# Patient Record
Sex: Female | Born: 1945 | Race: White | Hispanic: No | Marital: Married | Smoking: Never smoker
Health system: Southern US, Community
[De-identification: ages and names within clinical notes are randomized; demographics above are authoritative.]

## PROBLEM LIST (undated history)

## (undated) DIAGNOSIS — I10 Essential (primary) hypertension: Secondary | ICD-10-CM

## (undated) DIAGNOSIS — E119 Type 2 diabetes mellitus without complications: Secondary | ICD-10-CM

## (undated) DIAGNOSIS — M1990 Unspecified osteoarthritis, unspecified site: Secondary | ICD-10-CM

---

## 1997-11-14 ENCOUNTER — Ambulatory Visit (HOSPITAL_COMMUNITY): Admission: RE | Admit: 1997-11-14 | Discharge: 1997-11-14 | Payer: Self-pay | Admitting: Gastroenterology

## 1999-10-08 ENCOUNTER — Encounter: Payer: Self-pay | Admitting: Family Medicine

## 1999-10-08 ENCOUNTER — Encounter: Admission: RE | Admit: 1999-10-08 | Discharge: 1999-10-08 | Payer: Self-pay | Admitting: Family Medicine

## 2000-10-20 ENCOUNTER — Encounter: Admission: RE | Admit: 2000-10-20 | Discharge: 2000-10-20 | Payer: Self-pay | Admitting: Family Medicine

## 2000-10-20 ENCOUNTER — Encounter: Payer: Self-pay | Admitting: Family Medicine

## 2000-11-07 ENCOUNTER — Ambulatory Visit (HOSPITAL_COMMUNITY): Admission: RE | Admit: 2000-11-07 | Discharge: 2000-11-07 | Payer: Self-pay | Admitting: Family Medicine

## 2002-04-11 ENCOUNTER — Encounter: Payer: Self-pay | Admitting: Family Medicine

## 2002-04-11 ENCOUNTER — Encounter: Admission: RE | Admit: 2002-04-11 | Discharge: 2002-04-11 | Payer: Self-pay | Admitting: Family Medicine

## 2003-04-10 ENCOUNTER — Other Ambulatory Visit: Admission: RE | Admit: 2003-04-10 | Discharge: 2003-04-10 | Payer: Self-pay | Admitting: Pediatric Nephrology

## 2008-06-19 ENCOUNTER — Other Ambulatory Visit: Admission: RE | Admit: 2008-06-19 | Discharge: 2008-06-19 | Payer: Self-pay | Admitting: Family Medicine

## 2008-06-25 ENCOUNTER — Encounter: Admission: RE | Admit: 2008-06-25 | Discharge: 2008-06-25 | Payer: Self-pay | Admitting: Family Medicine

## 2011-03-15 DIAGNOSIS — I10 Essential (primary) hypertension: Secondary | ICD-10-CM | POA: Diagnosis not present

## 2011-03-15 DIAGNOSIS — E119 Type 2 diabetes mellitus without complications: Secondary | ICD-10-CM | POA: Diagnosis not present

## 2011-03-15 DIAGNOSIS — E7800 Pure hypercholesterolemia, unspecified: Secondary | ICD-10-CM | POA: Diagnosis not present

## 2011-09-20 DIAGNOSIS — E7800 Pure hypercholesterolemia, unspecified: Secondary | ICD-10-CM | POA: Diagnosis not present

## 2011-09-20 DIAGNOSIS — R21 Rash and other nonspecific skin eruption: Secondary | ICD-10-CM | POA: Diagnosis not present

## 2011-09-20 DIAGNOSIS — I10 Essential (primary) hypertension: Secondary | ICD-10-CM | POA: Diagnosis not present

## 2011-09-20 DIAGNOSIS — E119 Type 2 diabetes mellitus without complications: Secondary | ICD-10-CM | POA: Diagnosis not present

## 2011-09-20 DIAGNOSIS — Z23 Encounter for immunization: Secondary | ICD-10-CM | POA: Diagnosis not present

## 2011-09-20 DIAGNOSIS — Z79899 Other long term (current) drug therapy: Secondary | ICD-10-CM | POA: Diagnosis not present

## 2012-03-19 DIAGNOSIS — E119 Type 2 diabetes mellitus without complications: Secondary | ICD-10-CM | POA: Diagnosis not present

## 2012-03-19 DIAGNOSIS — Z1331 Encounter for screening for depression: Secondary | ICD-10-CM | POA: Diagnosis not present

## 2012-03-19 DIAGNOSIS — E7800 Pure hypercholesterolemia, unspecified: Secondary | ICD-10-CM | POA: Diagnosis not present

## 2012-03-19 DIAGNOSIS — I10 Essential (primary) hypertension: Secondary | ICD-10-CM | POA: Diagnosis not present

## 2012-06-07 DIAGNOSIS — E119 Type 2 diabetes mellitus without complications: Secondary | ICD-10-CM | POA: Diagnosis not present

## 2012-06-07 DIAGNOSIS — H2589 Other age-related cataract: Secondary | ICD-10-CM | POA: Diagnosis not present

## 2012-06-07 DIAGNOSIS — D3130 Benign neoplasm of unspecified choroid: Secondary | ICD-10-CM | POA: Diagnosis not present

## 2012-09-19 DIAGNOSIS — I10 Essential (primary) hypertension: Secondary | ICD-10-CM | POA: Diagnosis not present

## 2012-09-19 DIAGNOSIS — E7800 Pure hypercholesterolemia, unspecified: Secondary | ICD-10-CM | POA: Diagnosis not present

## 2012-09-19 DIAGNOSIS — E119 Type 2 diabetes mellitus without complications: Secondary | ICD-10-CM | POA: Diagnosis not present

## 2012-09-19 DIAGNOSIS — Z23 Encounter for immunization: Secondary | ICD-10-CM | POA: Diagnosis not present

## 2012-12-18 DIAGNOSIS — E7800 Pure hypercholesterolemia, unspecified: Secondary | ICD-10-CM | POA: Diagnosis not present

## 2012-12-18 DIAGNOSIS — I10 Essential (primary) hypertension: Secondary | ICD-10-CM | POA: Diagnosis not present

## 2012-12-18 DIAGNOSIS — Z23 Encounter for immunization: Secondary | ICD-10-CM | POA: Diagnosis not present

## 2012-12-18 DIAGNOSIS — E119 Type 2 diabetes mellitus without complications: Secondary | ICD-10-CM | POA: Diagnosis not present

## 2013-06-18 DIAGNOSIS — Z1331 Encounter for screening for depression: Secondary | ICD-10-CM | POA: Diagnosis not present

## 2013-06-18 DIAGNOSIS — E7800 Pure hypercholesterolemia, unspecified: Secondary | ICD-10-CM | POA: Diagnosis not present

## 2013-06-18 DIAGNOSIS — I10 Essential (primary) hypertension: Secondary | ICD-10-CM | POA: Diagnosis not present

## 2013-06-18 DIAGNOSIS — Z1211 Encounter for screening for malignant neoplasm of colon: Secondary | ICD-10-CM | POA: Diagnosis not present

## 2013-06-18 DIAGNOSIS — E119 Type 2 diabetes mellitus without complications: Secondary | ICD-10-CM | POA: Diagnosis not present

## 2013-06-27 DIAGNOSIS — H2589 Other age-related cataract: Secondary | ICD-10-CM | POA: Diagnosis not present

## 2013-06-27 DIAGNOSIS — D3130 Benign neoplasm of unspecified choroid: Secondary | ICD-10-CM | POA: Diagnosis not present

## 2013-06-27 DIAGNOSIS — E119 Type 2 diabetes mellitus without complications: Secondary | ICD-10-CM | POA: Diagnosis not present

## 2013-08-16 DIAGNOSIS — K5730 Diverticulosis of large intestine without perforation or abscess without bleeding: Secondary | ICD-10-CM | POA: Diagnosis not present

## 2013-08-16 DIAGNOSIS — D126 Benign neoplasm of colon, unspecified: Secondary | ICD-10-CM | POA: Diagnosis not present

## 2013-08-16 DIAGNOSIS — Z1211 Encounter for screening for malignant neoplasm of colon: Secondary | ICD-10-CM | POA: Diagnosis not present

## 2013-12-18 DIAGNOSIS — Z23 Encounter for immunization: Secondary | ICD-10-CM | POA: Diagnosis not present

## 2013-12-18 DIAGNOSIS — E119 Type 2 diabetes mellitus without complications: Secondary | ICD-10-CM | POA: Diagnosis not present

## 2013-12-18 DIAGNOSIS — I10 Essential (primary) hypertension: Secondary | ICD-10-CM | POA: Diagnosis not present

## 2013-12-18 DIAGNOSIS — E780 Pure hypercholesterolemia: Secondary | ICD-10-CM | POA: Diagnosis not present

## 2014-03-05 DIAGNOSIS — R05 Cough: Secondary | ICD-10-CM | POA: Diagnosis not present

## 2014-03-05 DIAGNOSIS — J988 Other specified respiratory disorders: Secondary | ICD-10-CM | POA: Diagnosis not present

## 2014-06-20 DIAGNOSIS — Z23 Encounter for immunization: Secondary | ICD-10-CM | POA: Diagnosis not present

## 2014-06-20 DIAGNOSIS — E780 Pure hypercholesterolemia: Secondary | ICD-10-CM | POA: Diagnosis not present

## 2014-06-20 DIAGNOSIS — I10 Essential (primary) hypertension: Secondary | ICD-10-CM | POA: Diagnosis not present

## 2014-06-20 DIAGNOSIS — E119 Type 2 diabetes mellitus without complications: Secondary | ICD-10-CM | POA: Diagnosis not present

## 2014-06-20 DIAGNOSIS — R21 Rash and other nonspecific skin eruption: Secondary | ICD-10-CM | POA: Diagnosis not present

## 2014-06-20 DIAGNOSIS — Z1389 Encounter for screening for other disorder: Secondary | ICD-10-CM | POA: Diagnosis not present

## 2014-06-20 DIAGNOSIS — Z794 Long term (current) use of insulin: Secondary | ICD-10-CM | POA: Diagnosis not present

## 2014-06-20 DIAGNOSIS — M1990 Unspecified osteoarthritis, unspecified site: Secondary | ICD-10-CM | POA: Diagnosis not present

## 2014-06-24 DIAGNOSIS — M1712 Unilateral primary osteoarthritis, left knee: Secondary | ICD-10-CM | POA: Diagnosis not present

## 2014-06-30 DIAGNOSIS — H2513 Age-related nuclear cataract, bilateral: Secondary | ICD-10-CM | POA: Diagnosis not present

## 2014-06-30 DIAGNOSIS — E119 Type 2 diabetes mellitus without complications: Secondary | ICD-10-CM | POA: Diagnosis not present

## 2014-06-30 DIAGNOSIS — H524 Presbyopia: Secondary | ICD-10-CM | POA: Diagnosis not present

## 2014-09-18 DIAGNOSIS — M1712 Unilateral primary osteoarthritis, left knee: Secondary | ICD-10-CM | POA: Diagnosis not present

## 2015-01-13 DIAGNOSIS — I10 Essential (primary) hypertension: Secondary | ICD-10-CM | POA: Diagnosis not present

## 2015-01-13 DIAGNOSIS — Z23 Encounter for immunization: Secondary | ICD-10-CM | POA: Diagnosis not present

## 2015-01-13 DIAGNOSIS — Z0000 Encounter for general adult medical examination without abnormal findings: Secondary | ICD-10-CM | POA: Diagnosis not present

## 2015-01-13 DIAGNOSIS — Z01411 Encounter for gynecological examination (general) (routine) with abnormal findings: Secondary | ICD-10-CM | POA: Diagnosis not present

## 2015-01-13 DIAGNOSIS — Z794 Long term (current) use of insulin: Secondary | ICD-10-CM | POA: Diagnosis not present

## 2015-01-13 DIAGNOSIS — M1990 Unspecified osteoarthritis, unspecified site: Secondary | ICD-10-CM | POA: Diagnosis not present

## 2015-01-13 DIAGNOSIS — Z1389 Encounter for screening for other disorder: Secondary | ICD-10-CM | POA: Diagnosis not present

## 2015-01-13 DIAGNOSIS — E7800 Pure hypercholesterolemia, unspecified: Secondary | ICD-10-CM | POA: Diagnosis not present

## 2015-01-13 DIAGNOSIS — E119 Type 2 diabetes mellitus without complications: Secondary | ICD-10-CM | POA: Diagnosis not present

## 2015-01-13 DIAGNOSIS — Z7984 Long term (current) use of oral hypoglycemic drugs: Secondary | ICD-10-CM | POA: Diagnosis not present

## 2015-01-20 ENCOUNTER — Other Ambulatory Visit: Payer: Self-pay | Admitting: Nurse Practitioner

## 2015-01-20 ENCOUNTER — Other Ambulatory Visit (HOSPITAL_COMMUNITY)
Admission: RE | Admit: 2015-01-20 | Discharge: 2015-01-20 | Disposition: A | Discharge status: Home / Self Care | Payer: Medicare Other | Source: Ambulatory Visit | Attending: Nurse Practitioner | Admitting: Nurse Practitioner

## 2015-01-20 DIAGNOSIS — Z124 Encounter for screening for malignant neoplasm of cervix: Secondary | ICD-10-CM | POA: Diagnosis not present

## 2015-01-20 DIAGNOSIS — Z01419 Encounter for gynecological examination (general) (routine) without abnormal findings: Secondary | ICD-10-CM | POA: Insufficient documentation

## 2015-01-20 DIAGNOSIS — B372 Candidiasis of skin and nail: Secondary | ICD-10-CM | POA: Diagnosis not present

## 2015-01-20 DIAGNOSIS — Z1231 Encounter for screening mammogram for malignant neoplasm of breast: Secondary | ICD-10-CM

## 2015-01-20 DIAGNOSIS — Z1151 Encounter for screening for human papillomavirus (HPV): Secondary | ICD-10-CM | POA: Insufficient documentation

## 2015-01-20 DIAGNOSIS — I868 Varicose veins of other specified sites: Secondary | ICD-10-CM | POA: Diagnosis not present

## 2015-01-22 LAB — CYTOLOGY - PAP

## 2015-02-06 ENCOUNTER — Ambulatory Visit
Admission: RE | Admit: 2015-02-06 | Discharge: 2015-02-06 | Disposition: A | Discharge status: Home / Self Care | Payer: Medicare Other | Source: Ambulatory Visit | Attending: Nurse Practitioner | Admitting: Nurse Practitioner

## 2015-02-06 ENCOUNTER — Other Ambulatory Visit: Payer: Self-pay | Admitting: Family Medicine

## 2015-02-06 DIAGNOSIS — Z1231 Encounter for screening mammogram for malignant neoplasm of breast: Secondary | ICD-10-CM

## 2015-05-12 DIAGNOSIS — Z6841 Body Mass Index (BMI) 40.0 and over, adult: Secondary | ICD-10-CM | POA: Diagnosis not present

## 2015-05-12 DIAGNOSIS — M1712 Unilateral primary osteoarthritis, left knee: Secondary | ICD-10-CM | POA: Diagnosis not present

## 2015-05-12 DIAGNOSIS — M7052 Other bursitis of knee, left knee: Secondary | ICD-10-CM | POA: Diagnosis not present

## 2015-05-15 DIAGNOSIS — M1712 Unilateral primary osteoarthritis, left knee: Secondary | ICD-10-CM | POA: Diagnosis not present

## 2015-05-19 DIAGNOSIS — M1712 Unilateral primary osteoarthritis, left knee: Secondary | ICD-10-CM | POA: Diagnosis not present

## 2015-05-21 DIAGNOSIS — M1712 Unilateral primary osteoarthritis, left knee: Secondary | ICD-10-CM | POA: Diagnosis not present

## 2015-05-26 DIAGNOSIS — M1712 Unilateral primary osteoarthritis, left knee: Secondary | ICD-10-CM | POA: Diagnosis not present

## 2015-05-29 DIAGNOSIS — M1712 Unilateral primary osteoarthritis, left knee: Secondary | ICD-10-CM | POA: Diagnosis not present

## 2015-06-02 DIAGNOSIS — M1712 Unilateral primary osteoarthritis, left knee: Secondary | ICD-10-CM | POA: Diagnosis not present

## 2015-06-05 DIAGNOSIS — M1712 Unilateral primary osteoarthritis, left knee: Secondary | ICD-10-CM | POA: Diagnosis not present

## 2015-06-08 DIAGNOSIS — M1712 Unilateral primary osteoarthritis, left knee: Secondary | ICD-10-CM | POA: Diagnosis not present

## 2015-06-12 DIAGNOSIS — M1712 Unilateral primary osteoarthritis, left knee: Secondary | ICD-10-CM | POA: Diagnosis not present

## 2015-06-23 DIAGNOSIS — M1712 Unilateral primary osteoarthritis, left knee: Secondary | ICD-10-CM | POA: Diagnosis not present

## 2015-06-23 DIAGNOSIS — M7052 Other bursitis of knee, left knee: Secondary | ICD-10-CM | POA: Diagnosis not present

## 2015-06-23 DIAGNOSIS — M25562 Pain in left knee: Secondary | ICD-10-CM | POA: Diagnosis not present

## 2015-07-01 DIAGNOSIS — H40053 Ocular hypertension, bilateral: Secondary | ICD-10-CM | POA: Diagnosis not present

## 2015-07-01 DIAGNOSIS — H2513 Age-related nuclear cataract, bilateral: Secondary | ICD-10-CM | POA: Diagnosis not present

## 2015-07-01 DIAGNOSIS — Z794 Long term (current) use of insulin: Secondary | ICD-10-CM | POA: Diagnosis not present

## 2015-07-01 DIAGNOSIS — H524 Presbyopia: Secondary | ICD-10-CM | POA: Diagnosis not present

## 2015-07-01 DIAGNOSIS — E119 Type 2 diabetes mellitus without complications: Secondary | ICD-10-CM | POA: Diagnosis not present

## 2015-07-13 DIAGNOSIS — Z6841 Body Mass Index (BMI) 40.0 and over, adult: Secondary | ICD-10-CM | POA: Diagnosis not present

## 2015-07-13 DIAGNOSIS — E119 Type 2 diabetes mellitus without complications: Secondary | ICD-10-CM | POA: Diagnosis not present

## 2015-07-13 DIAGNOSIS — E7800 Pure hypercholesterolemia, unspecified: Secondary | ICD-10-CM | POA: Diagnosis not present

## 2015-07-13 DIAGNOSIS — Z7984 Long term (current) use of oral hypoglycemic drugs: Secondary | ICD-10-CM | POA: Diagnosis not present

## 2015-07-13 DIAGNOSIS — I10 Essential (primary) hypertension: Secondary | ICD-10-CM | POA: Diagnosis not present

## 2015-07-13 DIAGNOSIS — Z794 Long term (current) use of insulin: Secondary | ICD-10-CM | POA: Diagnosis not present

## 2015-11-03 DIAGNOSIS — M25562 Pain in left knee: Secondary | ICD-10-CM | POA: Diagnosis not present

## 2015-11-03 DIAGNOSIS — M25561 Pain in right knee: Secondary | ICD-10-CM | POA: Diagnosis not present

## 2015-11-03 DIAGNOSIS — M170 Bilateral primary osteoarthritis of knee: Secondary | ICD-10-CM | POA: Diagnosis not present

## 2015-11-09 DIAGNOSIS — M25562 Pain in left knee: Secondary | ICD-10-CM | POA: Diagnosis not present

## 2015-11-09 DIAGNOSIS — M1712 Unilateral primary osteoarthritis, left knee: Secondary | ICD-10-CM | POA: Diagnosis not present

## 2015-11-10 DIAGNOSIS — M1711 Unilateral primary osteoarthritis, right knee: Secondary | ICD-10-CM | POA: Diagnosis not present

## 2015-11-10 DIAGNOSIS — M25561 Pain in right knee: Secondary | ICD-10-CM | POA: Diagnosis not present

## 2015-11-16 DIAGNOSIS — M25562 Pain in left knee: Secondary | ICD-10-CM | POA: Diagnosis not present

## 2015-11-16 DIAGNOSIS — M1712 Unilateral primary osteoarthritis, left knee: Secondary | ICD-10-CM | POA: Diagnosis not present

## 2015-11-17 DIAGNOSIS — M25561 Pain in right knee: Secondary | ICD-10-CM | POA: Diagnosis not present

## 2015-11-17 DIAGNOSIS — M1711 Unilateral primary osteoarthritis, right knee: Secondary | ICD-10-CM | POA: Diagnosis not present

## 2015-11-23 DIAGNOSIS — M25562 Pain in left knee: Secondary | ICD-10-CM | POA: Diagnosis not present

## 2015-11-23 DIAGNOSIS — M1712 Unilateral primary osteoarthritis, left knee: Secondary | ICD-10-CM | POA: Diagnosis not present

## 2015-11-24 DIAGNOSIS — M1711 Unilateral primary osteoarthritis, right knee: Secondary | ICD-10-CM | POA: Diagnosis not present

## 2015-11-24 DIAGNOSIS — M25561 Pain in right knee: Secondary | ICD-10-CM | POA: Diagnosis not present

## 2015-11-30 DIAGNOSIS — M170 Bilateral primary osteoarthritis of knee: Secondary | ICD-10-CM | POA: Diagnosis not present

## 2015-11-30 DIAGNOSIS — M25562 Pain in left knee: Secondary | ICD-10-CM | POA: Diagnosis not present

## 2015-11-30 DIAGNOSIS — M25561 Pain in right knee: Secondary | ICD-10-CM | POA: Diagnosis not present

## 2016-01-06 DIAGNOSIS — H40053 Ocular hypertension, bilateral: Secondary | ICD-10-CM | POA: Diagnosis not present

## 2016-01-18 DIAGNOSIS — I10 Essential (primary) hypertension: Secondary | ICD-10-CM | POA: Diagnosis not present

## 2016-01-18 DIAGNOSIS — E7800 Pure hypercholesterolemia, unspecified: Secondary | ICD-10-CM | POA: Diagnosis not present

## 2016-01-18 DIAGNOSIS — M170 Bilateral primary osteoarthritis of knee: Secondary | ICD-10-CM | POA: Diagnosis not present

## 2016-01-18 DIAGNOSIS — Z23 Encounter for immunization: Secondary | ICD-10-CM | POA: Diagnosis not present

## 2016-01-18 DIAGNOSIS — Z794 Long term (current) use of insulin: Secondary | ICD-10-CM | POA: Diagnosis not present

## 2016-01-18 DIAGNOSIS — Z0000 Encounter for general adult medical examination without abnormal findings: Secondary | ICD-10-CM | POA: Diagnosis not present

## 2016-01-18 DIAGNOSIS — Z1389 Encounter for screening for other disorder: Secondary | ICD-10-CM | POA: Diagnosis not present

## 2016-01-18 DIAGNOSIS — Z6841 Body Mass Index (BMI) 40.0 and over, adult: Secondary | ICD-10-CM | POA: Diagnosis not present

## 2016-01-18 DIAGNOSIS — E119 Type 2 diabetes mellitus without complications: Secondary | ICD-10-CM | POA: Diagnosis not present

## 2016-01-18 DIAGNOSIS — E2839 Other primary ovarian failure: Secondary | ICD-10-CM | POA: Diagnosis not present

## 2016-01-18 DIAGNOSIS — Z7984 Long term (current) use of oral hypoglycemic drugs: Secondary | ICD-10-CM | POA: Diagnosis not present

## 2016-01-25 ENCOUNTER — Other Ambulatory Visit: Payer: Self-pay | Admitting: Family Medicine

## 2016-01-25 DIAGNOSIS — E2839 Other primary ovarian failure: Secondary | ICD-10-CM

## 2016-01-27 ENCOUNTER — Ambulatory Visit
Admission: RE | Admit: 2016-01-27 | Discharge: 2016-01-27 | Disposition: A | Discharge status: Home / Self Care | Payer: Medicare Other | Source: Ambulatory Visit | Attending: Family Medicine | Admitting: Family Medicine

## 2016-01-27 DIAGNOSIS — E2839 Other primary ovarian failure: Secondary | ICD-10-CM

## 2016-01-27 DIAGNOSIS — Z13820 Encounter for screening for osteoporosis: Secondary | ICD-10-CM | POA: Diagnosis not present

## 2016-01-27 DIAGNOSIS — Z780 Asymptomatic menopausal state: Secondary | ICD-10-CM | POA: Diagnosis not present

## 2016-01-29 DIAGNOSIS — H40053 Ocular hypertension, bilateral: Secondary | ICD-10-CM | POA: Diagnosis not present

## 2016-03-07 DIAGNOSIS — M25561 Pain in right knee: Secondary | ICD-10-CM | POA: Diagnosis not present

## 2016-03-07 DIAGNOSIS — M170 Bilateral primary osteoarthritis of knee: Secondary | ICD-10-CM | POA: Diagnosis not present

## 2016-03-07 DIAGNOSIS — M25562 Pain in left knee: Secondary | ICD-10-CM | POA: Diagnosis not present

## 2016-03-08 DIAGNOSIS — H40053 Ocular hypertension, bilateral: Secondary | ICD-10-CM | POA: Diagnosis not present

## 2016-03-15 DIAGNOSIS — M1712 Unilateral primary osteoarthritis, left knee: Secondary | ICD-10-CM | POA: Diagnosis not present

## 2016-03-15 DIAGNOSIS — M25562 Pain in left knee: Secondary | ICD-10-CM | POA: Diagnosis not present

## 2016-03-16 DIAGNOSIS — M1711 Unilateral primary osteoarthritis, right knee: Secondary | ICD-10-CM | POA: Diagnosis not present

## 2016-03-16 DIAGNOSIS — M25561 Pain in right knee: Secondary | ICD-10-CM | POA: Diagnosis not present

## 2016-03-21 DIAGNOSIS — M25562 Pain in left knee: Secondary | ICD-10-CM | POA: Diagnosis not present

## 2016-03-21 DIAGNOSIS — M1712 Unilateral primary osteoarthritis, left knee: Secondary | ICD-10-CM | POA: Diagnosis not present

## 2016-03-22 DIAGNOSIS — M25561 Pain in right knee: Secondary | ICD-10-CM | POA: Diagnosis not present

## 2016-03-22 DIAGNOSIS — M1711 Unilateral primary osteoarthritis, right knee: Secondary | ICD-10-CM | POA: Diagnosis not present

## 2016-03-28 DIAGNOSIS — M170 Bilateral primary osteoarthritis of knee: Secondary | ICD-10-CM | POA: Diagnosis not present

## 2016-03-28 DIAGNOSIS — M25561 Pain in right knee: Secondary | ICD-10-CM | POA: Diagnosis not present

## 2016-03-28 DIAGNOSIS — M25562 Pain in left knee: Secondary | ICD-10-CM | POA: Diagnosis not present

## 2016-04-06 DIAGNOSIS — H40053 Ocular hypertension, bilateral: Secondary | ICD-10-CM | POA: Diagnosis not present

## 2016-07-04 DIAGNOSIS — M170 Bilateral primary osteoarthritis of knee: Secondary | ICD-10-CM | POA: Diagnosis not present

## 2016-07-04 DIAGNOSIS — M25562 Pain in left knee: Secondary | ICD-10-CM | POA: Diagnosis not present

## 2016-07-04 DIAGNOSIS — R262 Difficulty in walking, not elsewhere classified: Secondary | ICD-10-CM | POA: Diagnosis not present

## 2016-07-04 DIAGNOSIS — M25561 Pain in right knee: Secondary | ICD-10-CM | POA: Diagnosis not present

## 2016-07-11 DIAGNOSIS — I10 Essential (primary) hypertension: Secondary | ICD-10-CM | POA: Diagnosis not present

## 2016-07-11 DIAGNOSIS — M1712 Unilateral primary osteoarthritis, left knee: Secondary | ICD-10-CM | POA: Diagnosis not present

## 2016-07-11 DIAGNOSIS — M1990 Unspecified osteoarthritis, unspecified site: Secondary | ICD-10-CM | POA: Diagnosis not present

## 2016-07-11 DIAGNOSIS — E7800 Pure hypercholesterolemia, unspecified: Secondary | ICD-10-CM | POA: Diagnosis not present

## 2016-07-11 DIAGNOSIS — Z7984 Long term (current) use of oral hypoglycemic drugs: Secondary | ICD-10-CM | POA: Diagnosis not present

## 2016-07-11 DIAGNOSIS — E119 Type 2 diabetes mellitus without complications: Secondary | ICD-10-CM | POA: Diagnosis not present

## 2016-07-11 DIAGNOSIS — Z6841 Body Mass Index (BMI) 40.0 and over, adult: Secondary | ICD-10-CM | POA: Diagnosis not present

## 2016-07-11 DIAGNOSIS — Z794 Long term (current) use of insulin: Secondary | ICD-10-CM | POA: Diagnosis not present

## 2016-08-16 DIAGNOSIS — M25562 Pain in left knee: Secondary | ICD-10-CM | POA: Diagnosis not present

## 2016-08-16 DIAGNOSIS — M25561 Pain in right knee: Secondary | ICD-10-CM | POA: Diagnosis not present

## 2016-10-10 DIAGNOSIS — E119 Type 2 diabetes mellitus without complications: Secondary | ICD-10-CM | POA: Diagnosis not present

## 2016-10-10 DIAGNOSIS — Z794 Long term (current) use of insulin: Secondary | ICD-10-CM | POA: Diagnosis not present

## 2016-10-10 DIAGNOSIS — H2513 Age-related nuclear cataract, bilateral: Secondary | ICD-10-CM | POA: Diagnosis not present

## 2016-10-10 DIAGNOSIS — H40003 Preglaucoma, unspecified, bilateral: Secondary | ICD-10-CM | POA: Diagnosis not present

## 2016-10-10 DIAGNOSIS — H40053 Ocular hypertension, bilateral: Secondary | ICD-10-CM | POA: Diagnosis not present

## 2016-10-10 DIAGNOSIS — D3131 Benign neoplasm of right choroid: Secondary | ICD-10-CM | POA: Diagnosis not present

## 2017-01-23 DIAGNOSIS — E7800 Pure hypercholesterolemia, unspecified: Secondary | ICD-10-CM | POA: Diagnosis not present

## 2017-01-23 DIAGNOSIS — I10 Essential (primary) hypertension: Secondary | ICD-10-CM | POA: Diagnosis not present

## 2017-01-23 DIAGNOSIS — Z0000 Encounter for general adult medical examination without abnormal findings: Secondary | ICD-10-CM | POA: Diagnosis not present

## 2017-01-23 DIAGNOSIS — Z794 Long term (current) use of insulin: Secondary | ICD-10-CM | POA: Diagnosis not present

## 2017-01-23 DIAGNOSIS — R5383 Other fatigue: Secondary | ICD-10-CM | POA: Diagnosis not present

## 2017-01-23 DIAGNOSIS — Z1389 Encounter for screening for other disorder: Secondary | ICD-10-CM | POA: Diagnosis not present

## 2017-01-23 DIAGNOSIS — Z6841 Body Mass Index (BMI) 40.0 and over, adult: Secondary | ICD-10-CM | POA: Diagnosis not present

## 2017-01-23 DIAGNOSIS — E119 Type 2 diabetes mellitus without complications: Secondary | ICD-10-CM | POA: Diagnosis not present

## 2017-07-11 DIAGNOSIS — M1712 Unilateral primary osteoarthritis, left knee: Secondary | ICD-10-CM | POA: Diagnosis not present

## 2017-07-11 DIAGNOSIS — M1711 Unilateral primary osteoarthritis, right knee: Secondary | ICD-10-CM | POA: Diagnosis not present

## 2017-07-11 DIAGNOSIS — E669 Obesity, unspecified: Secondary | ICD-10-CM | POA: Diagnosis not present

## 2017-07-14 DIAGNOSIS — H40053 Ocular hypertension, bilateral: Secondary | ICD-10-CM | POA: Diagnosis not present

## 2017-07-17 DIAGNOSIS — H2513 Age-related nuclear cataract, bilateral: Secondary | ICD-10-CM | POA: Diagnosis not present

## 2017-07-17 DIAGNOSIS — H40053 Ocular hypertension, bilateral: Secondary | ICD-10-CM | POA: Diagnosis not present

## 2017-07-17 DIAGNOSIS — Z794 Long term (current) use of insulin: Secondary | ICD-10-CM | POA: Diagnosis not present

## 2017-07-17 DIAGNOSIS — E119 Type 2 diabetes mellitus without complications: Secondary | ICD-10-CM | POA: Diagnosis not present

## 2017-07-17 DIAGNOSIS — Z7984 Long term (current) use of oral hypoglycemic drugs: Secondary | ICD-10-CM | POA: Diagnosis not present

## 2017-07-25 DIAGNOSIS — R609 Edema, unspecified: Secondary | ICD-10-CM | POA: Diagnosis not present

## 2017-07-25 DIAGNOSIS — E119 Type 2 diabetes mellitus without complications: Secondary | ICD-10-CM | POA: Diagnosis not present

## 2017-07-25 DIAGNOSIS — I10 Essential (primary) hypertension: Secondary | ICD-10-CM | POA: Diagnosis not present

## 2017-07-25 DIAGNOSIS — E7800 Pure hypercholesterolemia, unspecified: Secondary | ICD-10-CM | POA: Diagnosis not present

## 2017-12-11 DIAGNOSIS — M25562 Pain in left knee: Secondary | ICD-10-CM | POA: Diagnosis not present

## 2017-12-11 DIAGNOSIS — M25561 Pain in right knee: Secondary | ICD-10-CM | POA: Diagnosis not present

## 2018-01-02 DIAGNOSIS — M25511 Pain in right shoulder: Secondary | ICD-10-CM | POA: Diagnosis not present

## 2018-01-11 DIAGNOSIS — Z6841 Body Mass Index (BMI) 40.0 and over, adult: Secondary | ICD-10-CM | POA: Diagnosis not present

## 2018-01-11 DIAGNOSIS — R635 Abnormal weight gain: Secondary | ICD-10-CM | POA: Diagnosis not present

## 2018-01-11 DIAGNOSIS — E119 Type 2 diabetes mellitus without complications: Secondary | ICD-10-CM | POA: Diagnosis not present

## 2018-01-11 DIAGNOSIS — Z794 Long term (current) use of insulin: Secondary | ICD-10-CM | POA: Diagnosis not present

## 2018-01-23 DIAGNOSIS — Z6841 Body Mass Index (BMI) 40.0 and over, adult: Secondary | ICD-10-CM | POA: Diagnosis not present

## 2018-01-23 DIAGNOSIS — Z794 Long term (current) use of insulin: Secondary | ICD-10-CM | POA: Diagnosis not present

## 2018-01-23 DIAGNOSIS — E119 Type 2 diabetes mellitus without complications: Secondary | ICD-10-CM | POA: Diagnosis not present

## 2018-01-23 DIAGNOSIS — R296 Repeated falls: Secondary | ICD-10-CM | POA: Diagnosis not present

## 2018-01-23 DIAGNOSIS — I10 Essential (primary) hypertension: Secondary | ICD-10-CM | POA: Diagnosis not present

## 2018-01-23 DIAGNOSIS — E669 Obesity, unspecified: Secondary | ICD-10-CM | POA: Diagnosis not present

## 2018-01-29 DIAGNOSIS — Z0000 Encounter for general adult medical examination without abnormal findings: Secondary | ICD-10-CM | POA: Diagnosis not present

## 2018-01-29 DIAGNOSIS — Z23 Encounter for immunization: Secondary | ICD-10-CM | POA: Diagnosis not present

## 2018-01-29 DIAGNOSIS — Z6841 Body Mass Index (BMI) 40.0 and over, adult: Secondary | ICD-10-CM | POA: Diagnosis not present

## 2018-01-29 DIAGNOSIS — E1169 Type 2 diabetes mellitus with other specified complication: Secondary | ICD-10-CM | POA: Diagnosis not present

## 2018-01-29 DIAGNOSIS — I10 Essential (primary) hypertension: Secondary | ICD-10-CM | POA: Diagnosis not present

## 2018-01-29 DIAGNOSIS — E119 Type 2 diabetes mellitus without complications: Secondary | ICD-10-CM | POA: Diagnosis not present

## 2018-01-29 DIAGNOSIS — Z794 Long term (current) use of insulin: Secondary | ICD-10-CM | POA: Diagnosis not present

## 2018-01-29 DIAGNOSIS — R2681 Unsteadiness on feet: Secondary | ICD-10-CM | POA: Diagnosis not present

## 2018-01-29 DIAGNOSIS — Z1159 Encounter for screening for other viral diseases: Secondary | ICD-10-CM | POA: Diagnosis not present

## 2018-01-29 DIAGNOSIS — E7800 Pure hypercholesterolemia, unspecified: Secondary | ICD-10-CM | POA: Diagnosis not present

## 2018-01-29 DIAGNOSIS — M25562 Pain in left knee: Secondary | ICD-10-CM | POA: Diagnosis not present

## 2018-01-30 DIAGNOSIS — M25561 Pain in right knee: Secondary | ICD-10-CM | POA: Diagnosis not present

## 2018-01-30 DIAGNOSIS — Z6841 Body Mass Index (BMI) 40.0 and over, adult: Secondary | ICD-10-CM | POA: Diagnosis not present

## 2018-01-30 DIAGNOSIS — M25562 Pain in left knee: Secondary | ICD-10-CM | POA: Diagnosis not present

## 2018-02-02 DIAGNOSIS — Z6841 Body Mass Index (BMI) 40.0 and over, adult: Secondary | ICD-10-CM | POA: Diagnosis not present

## 2018-02-02 DIAGNOSIS — Z794 Long term (current) use of insulin: Secondary | ICD-10-CM | POA: Diagnosis not present

## 2018-02-02 DIAGNOSIS — E119 Type 2 diabetes mellitus without complications: Secondary | ICD-10-CM | POA: Diagnosis not present

## 2018-02-02 DIAGNOSIS — I10 Essential (primary) hypertension: Secondary | ICD-10-CM | POA: Diagnosis not present

## 2018-02-09 DIAGNOSIS — Z6841 Body Mass Index (BMI) 40.0 and over, adult: Secondary | ICD-10-CM | POA: Diagnosis not present

## 2018-02-16 DIAGNOSIS — Z6841 Body Mass Index (BMI) 40.0 and over, adult: Secondary | ICD-10-CM | POA: Diagnosis not present

## 2018-02-23 DIAGNOSIS — Z794 Long term (current) use of insulin: Secondary | ICD-10-CM | POA: Diagnosis not present

## 2018-02-23 DIAGNOSIS — Z6841 Body Mass Index (BMI) 40.0 and over, adult: Secondary | ICD-10-CM | POA: Diagnosis not present

## 2018-02-23 DIAGNOSIS — E119 Type 2 diabetes mellitus without complications: Secondary | ICD-10-CM | POA: Diagnosis not present

## 2018-02-23 DIAGNOSIS — I10 Essential (primary) hypertension: Secondary | ICD-10-CM | POA: Diagnosis not present

## 2018-02-23 DIAGNOSIS — M170 Bilateral primary osteoarthritis of knee: Secondary | ICD-10-CM | POA: Diagnosis not present

## 2018-03-09 DIAGNOSIS — Z6841 Body Mass Index (BMI) 40.0 and over, adult: Secondary | ICD-10-CM | POA: Diagnosis not present

## 2018-03-19 DIAGNOSIS — Z6841 Body Mass Index (BMI) 40.0 and over, adult: Secondary | ICD-10-CM | POA: Diagnosis not present

## 2018-04-20 DIAGNOSIS — E669 Obesity, unspecified: Secondary | ICD-10-CM | POA: Diagnosis not present

## 2018-04-20 DIAGNOSIS — Z6841 Body Mass Index (BMI) 40.0 and over, adult: Secondary | ICD-10-CM | POA: Diagnosis not present

## 2018-04-20 DIAGNOSIS — F54 Psychological and behavioral factors associated with disorders or diseases classified elsewhere: Secondary | ICD-10-CM | POA: Diagnosis not present

## 2018-04-20 DIAGNOSIS — Z794 Long term (current) use of insulin: Secondary | ICD-10-CM | POA: Diagnosis not present

## 2018-04-20 DIAGNOSIS — E119 Type 2 diabetes mellitus without complications: Secondary | ICD-10-CM | POA: Diagnosis not present

## 2018-04-26 DIAGNOSIS — I10 Essential (primary) hypertension: Secondary | ICD-10-CM | POA: Diagnosis not present

## 2018-04-26 DIAGNOSIS — Z6841 Body Mass Index (BMI) 40.0 and over, adult: Secondary | ICD-10-CM | POA: Diagnosis not present

## 2018-04-26 DIAGNOSIS — E11649 Type 2 diabetes mellitus with hypoglycemia without coma: Secondary | ICD-10-CM | POA: Diagnosis not present

## 2018-04-26 DIAGNOSIS — Z794 Long term (current) use of insulin: Secondary | ICD-10-CM | POA: Diagnosis not present

## 2018-05-01 DIAGNOSIS — Z794 Long term (current) use of insulin: Secondary | ICD-10-CM | POA: Diagnosis not present

## 2018-05-01 DIAGNOSIS — I10 Essential (primary) hypertension: Secondary | ICD-10-CM | POA: Diagnosis not present

## 2018-05-01 DIAGNOSIS — E162 Hypoglycemia, unspecified: Secondary | ICD-10-CM | POA: Diagnosis not present

## 2018-05-01 DIAGNOSIS — Z6841 Body Mass Index (BMI) 40.0 and over, adult: Secondary | ICD-10-CM | POA: Diagnosis not present

## 2018-05-01 DIAGNOSIS — E119 Type 2 diabetes mellitus without complications: Secondary | ICD-10-CM | POA: Diagnosis not present

## 2018-05-11 DIAGNOSIS — Z6841 Body Mass Index (BMI) 40.0 and over, adult: Secondary | ICD-10-CM | POA: Diagnosis not present

## 2018-05-25 DIAGNOSIS — E119 Type 2 diabetes mellitus without complications: Secondary | ICD-10-CM | POA: Diagnosis not present

## 2018-05-25 DIAGNOSIS — Z794 Long term (current) use of insulin: Secondary | ICD-10-CM | POA: Diagnosis not present

## 2018-05-25 DIAGNOSIS — Z6841 Body Mass Index (BMI) 40.0 and over, adult: Secondary | ICD-10-CM | POA: Diagnosis not present

## 2018-06-04 DIAGNOSIS — Z6839 Body mass index (BMI) 39.0-39.9, adult: Secondary | ICD-10-CM | POA: Diagnosis not present

## 2018-06-04 DIAGNOSIS — E669 Obesity, unspecified: Secondary | ICD-10-CM | POA: Diagnosis not present

## 2018-06-04 DIAGNOSIS — E119 Type 2 diabetes mellitus without complications: Secondary | ICD-10-CM | POA: Diagnosis not present

## 2018-06-04 DIAGNOSIS — Z794 Long term (current) use of insulin: Secondary | ICD-10-CM | POA: Diagnosis not present

## 2018-06-14 DIAGNOSIS — Z6839 Body mass index (BMI) 39.0-39.9, adult: Secondary | ICD-10-CM | POA: Diagnosis not present

## 2018-06-14 DIAGNOSIS — E669 Obesity, unspecified: Secondary | ICD-10-CM | POA: Diagnosis not present

## 2018-06-29 DIAGNOSIS — E669 Obesity, unspecified: Secondary | ICD-10-CM | POA: Diagnosis not present

## 2018-06-29 DIAGNOSIS — Z794 Long term (current) use of insulin: Secondary | ICD-10-CM | POA: Diagnosis not present

## 2018-06-29 DIAGNOSIS — Z6839 Body mass index (BMI) 39.0-39.9, adult: Secondary | ICD-10-CM | POA: Diagnosis not present

## 2018-06-29 DIAGNOSIS — E119 Type 2 diabetes mellitus without complications: Secondary | ICD-10-CM | POA: Diagnosis not present

## 2018-07-13 DIAGNOSIS — Z794 Long term (current) use of insulin: Secondary | ICD-10-CM | POA: Diagnosis not present

## 2018-07-13 DIAGNOSIS — E119 Type 2 diabetes mellitus without complications: Secondary | ICD-10-CM | POA: Diagnosis not present

## 2018-07-13 DIAGNOSIS — I10 Essential (primary) hypertension: Secondary | ICD-10-CM | POA: Diagnosis not present

## 2018-07-13 DIAGNOSIS — E669 Obesity, unspecified: Secondary | ICD-10-CM | POA: Diagnosis not present

## 2018-07-13 DIAGNOSIS — Z6838 Body mass index (BMI) 38.0-38.9, adult: Secondary | ICD-10-CM | POA: Diagnosis not present

## 2018-07-23 DIAGNOSIS — H524 Presbyopia: Secondary | ICD-10-CM | POA: Diagnosis not present

## 2018-07-23 DIAGNOSIS — H5213 Myopia, bilateral: Secondary | ICD-10-CM | POA: Diagnosis not present

## 2018-07-23 DIAGNOSIS — Z794 Long term (current) use of insulin: Secondary | ICD-10-CM | POA: Diagnosis not present

## 2018-07-23 DIAGNOSIS — H25813 Combined forms of age-related cataract, bilateral: Secondary | ICD-10-CM | POA: Diagnosis not present

## 2018-07-23 DIAGNOSIS — E119 Type 2 diabetes mellitus without complications: Secondary | ICD-10-CM | POA: Diagnosis not present

## 2018-07-23 DIAGNOSIS — H40053 Ocular hypertension, bilateral: Secondary | ICD-10-CM | POA: Diagnosis not present

## 2018-07-23 DIAGNOSIS — H52203 Unspecified astigmatism, bilateral: Secondary | ICD-10-CM | POA: Diagnosis not present

## 2018-07-27 DIAGNOSIS — Z7182 Exercise counseling: Secondary | ICD-10-CM | POA: Diagnosis not present

## 2018-07-27 DIAGNOSIS — Z6838 Body mass index (BMI) 38.0-38.9, adult: Secondary | ICD-10-CM | POA: Diagnosis not present

## 2018-07-27 DIAGNOSIS — E669 Obesity, unspecified: Secondary | ICD-10-CM | POA: Diagnosis not present

## 2018-08-10 DIAGNOSIS — Z6841 Body Mass Index (BMI) 40.0 and over, adult: Secondary | ICD-10-CM | POA: Diagnosis not present

## 2018-08-14 DIAGNOSIS — Z6837 Body mass index (BMI) 37.0-37.9, adult: Secondary | ICD-10-CM | POA: Diagnosis not present

## 2018-08-14 DIAGNOSIS — M25561 Pain in right knee: Secondary | ICD-10-CM | POA: Diagnosis not present

## 2018-08-14 DIAGNOSIS — M25562 Pain in left knee: Secondary | ICD-10-CM | POA: Diagnosis not present

## 2018-08-23 DIAGNOSIS — L659 Nonscarring hair loss, unspecified: Secondary | ICD-10-CM | POA: Diagnosis not present

## 2018-08-23 DIAGNOSIS — E7800 Pure hypercholesterolemia, unspecified: Secondary | ICD-10-CM | POA: Diagnosis not present

## 2018-08-23 DIAGNOSIS — H0011 Chalazion right upper eyelid: Secondary | ICD-10-CM | POA: Diagnosis not present

## 2018-08-23 DIAGNOSIS — I10 Essential (primary) hypertension: Secondary | ICD-10-CM | POA: Diagnosis not present

## 2018-08-23 DIAGNOSIS — E119 Type 2 diabetes mellitus without complications: Secondary | ICD-10-CM | POA: Diagnosis not present

## 2018-08-23 DIAGNOSIS — Z01810 Encounter for preprocedural cardiovascular examination: Secondary | ICD-10-CM | POA: Diagnosis not present

## 2018-09-07 DIAGNOSIS — Z6836 Body mass index (BMI) 36.0-36.9, adult: Secondary | ICD-10-CM | POA: Diagnosis not present

## 2018-09-07 DIAGNOSIS — E669 Obesity, unspecified: Secondary | ICD-10-CM | POA: Diagnosis not present

## 2018-09-07 DIAGNOSIS — Z713 Dietary counseling and surveillance: Secondary | ICD-10-CM | POA: Diagnosis not present

## 2018-09-19 DIAGNOSIS — E119 Type 2 diabetes mellitus without complications: Secondary | ICD-10-CM | POA: Diagnosis not present

## 2018-09-19 DIAGNOSIS — E1169 Type 2 diabetes mellitus with other specified complication: Secondary | ICD-10-CM | POA: Diagnosis not present

## 2018-09-19 DIAGNOSIS — M1712 Unilateral primary osteoarthritis, left knee: Secondary | ICD-10-CM | POA: Diagnosis not present

## 2018-09-19 DIAGNOSIS — M170 Bilateral primary osteoarthritis of knee: Secondary | ICD-10-CM | POA: Diagnosis not present

## 2018-09-19 DIAGNOSIS — E7800 Pure hypercholesterolemia, unspecified: Secondary | ICD-10-CM | POA: Diagnosis not present

## 2018-09-19 DIAGNOSIS — I10 Essential (primary) hypertension: Secondary | ICD-10-CM | POA: Diagnosis not present

## 2018-10-02 DIAGNOSIS — Z794 Long term (current) use of insulin: Secondary | ICD-10-CM | POA: Diagnosis not present

## 2018-10-02 DIAGNOSIS — Z6836 Body mass index (BMI) 36.0-36.9, adult: Secondary | ICD-10-CM | POA: Diagnosis not present

## 2018-10-02 DIAGNOSIS — I10 Essential (primary) hypertension: Secondary | ICD-10-CM | POA: Diagnosis not present

## 2018-10-02 DIAGNOSIS — E669 Obesity, unspecified: Secondary | ICD-10-CM | POA: Diagnosis not present

## 2018-10-02 DIAGNOSIS — E119 Type 2 diabetes mellitus without complications: Secondary | ICD-10-CM | POA: Diagnosis not present

## 2018-10-04 DIAGNOSIS — M25562 Pain in left knee: Secondary | ICD-10-CM | POA: Diagnosis not present

## 2018-10-11 ENCOUNTER — Ambulatory Visit: Payer: Self-pay | Admitting: Physician Assistant

## 2018-10-11 NOTE — H&P (Signed)
TOTAL KNEE ADMISSION H&P  Patient is being admitted for left total knee arthroplasty.  Subjective:  Chief Complaint:left knee pain.  HPI: Victoria Reeves, 73 y.o. female, has a history of pain and functional disability in the left knee due to arthritis and has failed non-surgical conservative treatments for greater than 12 weeks to includeNSAID's and/or analgesics, corticosteriod injections, supervised PT with diminished ADL's post treatment, use of assistive devices and activity modification.  Onset of symptoms was gradual, starting 6 years ago with gradually worsening course since that time. The patient noted no past surgery on the left knee(s).  Patient currently rates pain in the left knee(s) at 9 out of 10 with activity. Patient has night pain, worsening of pain with activity and weight bearing, pain that interferes with activities of daily living, pain with passive range of motion, crepitus and joint swelling.  Patient has evidence of periarticular osteophytes and joint space narrowing by imaging studies.  There is no active infection.  There are no active problems to display for this patient.  No past medical history on file. DM, HTN, HLD  Current medications: Metformin, Telmisartan, Meloxicam, Lovastatin, Levmir and baby Aspirin.  Allergies: No known drug allergies.    Family history: Brother with diabetes.  Grandparents with cancer.  Child with high blood pressure.   Social history: Does not smoke or drink.  She is married and lives with her spouse in a one story residence.  She is retired. Not on File  Social History   Tobacco Use  . Smoking status: Not on file  Substance Use Topics  . Alcohol use: Not on file    No family history on file.   Review of Systems  Constitutional: Positive for weight loss.  Musculoskeletal: Positive for joint pain.  All other systems reviewed and are negative.   Objective:  Physical Exam  Constitutional: She is oriented to person, place,  and time. She appears well-developed and well-nourished. No distress.  HENT:  Head: Normocephalic and atraumatic.  Eyes: Pupils are equal, round, and reactive to light. Conjunctivae and EOM are normal.  Neck: Normal range of motion. Neck supple.  Cardiovascular: Normal rate, regular rhythm, normal heart sounds and intact distal pulses.  Respiratory: Effort normal and breath sounds normal. No respiratory distress. She has no wheezes.  GI: Soft. Bowel sounds are normal. She exhibits no distension. There is no abdominal tenderness.  Musculoskeletal:     Left knee: She exhibits swelling and bony tenderness. She exhibits normal range of motion. Tenderness found. Medial joint line and lateral joint line tenderness noted.  Lymphadenopathy:    She has no cervical adenopathy.  Neurological: She is alert and oriented to person, place, and time.  Skin: Skin is warm and dry. No rash noted. No erythema.  Psychiatric: She has a normal mood and affect. Her behavior is normal.    Vital signs in last 24 hours: @VSRANGES @  Labs:   There is no height or weight on file to calculate BMI.   Imaging Review Plain radiographs demonstrate severe degenerative joint disease of the left knee(s). The overall alignment issignificant varus. The bone quality appears to be good for age and reported activity level.      Assessment/Plan:  End stage arthritis, left knee   The patient history, physical examination, clinical judgment of the provider and imaging studies are consistent with end stage degenerative joint disease of the left knee(s) and total knee arthroplasty is deemed medically necessary. The treatment options including medical management, injection  therapy arthroscopy and arthroplasty were discussed at length. The risks and benefits of total knee arthroplasty were presented and reviewed. The risks due to aseptic loosening, infection, stiffness, patella tracking problems, thromboembolic complications and  other imponderables were discussed. The patient acknowledged the explanation, agreed to proceed with the plan and consent was signed. Patient is being admitted for inpatient treatment for surgery, pain control, PT, OT, prophylactic antibiotics, VTE prophylaxis, progressive ambulation and ADL's and discharge planning. The patient is planning to be discharged home with home health services    Anticipated LOS equal to or greater than 2 midnights due to - Age 83 and older with one or more of the following:  - Obesity  - Expected need for hospital services (PT, OT, Nursing) required for safe  discharge  - Anticipated need for postoperative skilled nursing care or inpatient rehab  - Active co-morbidities: Diabetes OR   - Unanticipated findings during/Post Surgery: None  - Patient is a high risk of re-admission due to: None

## 2018-10-11 NOTE — H&P (View-Only) (Signed)
TOTAL KNEE ADMISSION H&P  Patient is being admitted for left total knee arthroplasty.  Subjective:  Chief Complaint:left knee pain.  HPI: Victoria Reeves, 73 y.o. female, has a history of pain and functional disability in the left knee due to arthritis and has failed non-surgical conservative treatments for greater than 12 weeks to includeNSAID's and/or analgesics, corticosteriod injections, supervised PT with diminished ADL's post treatment, use of assistive devices and activity modification.  Onset of symptoms was gradual, starting 6 years ago with gradually worsening course since that time. The patient noted no past surgery on the left knee(s).  Patient currently rates pain in the left knee(s) at 9 out of 10 with activity. Patient has night pain, worsening of pain with activity and weight bearing, pain that interferes with activities of daily living, pain with passive range of motion, crepitus and joint swelling.  Patient has evidence of periarticular osteophytes and joint space narrowing by imaging studies.  There is no active infection.  There are no active problems to display for this patient.  No past medical history on file. DM, HTN, HLD  Current medications: Metformin, Telmisartan, Meloxicam, Lovastatin, Levmir and baby Aspirin.  Allergies: No known drug allergies.    Family history: Brother with diabetes.  Grandparents with cancer.  Child with high blood pressure.   Social history: Does not smoke or drink.  She is married and lives with her spouse in a one story residence.  She is retired. Not on File  Social History   Tobacco Use  . Smoking status: Not on file  Substance Use Topics  . Alcohol use: Not on file    No family history on file.   Review of Systems  Constitutional: Positive for weight loss.  Musculoskeletal: Positive for joint pain.  All other systems reviewed and are negative.   Objective:  Physical Exam  Constitutional: She is oriented to person, place,  and time. She appears well-developed and well-nourished. No distress.  HENT:  Head: Normocephalic and atraumatic.  Eyes: Pupils are equal, round, and reactive to light. Conjunctivae and EOM are normal.  Neck: Normal range of motion. Neck supple.  Cardiovascular: Normal rate, regular rhythm, normal heart sounds and intact distal pulses.  Respiratory: Effort normal and breath sounds normal. No respiratory distress. She has no wheezes.  GI: Soft. Bowel sounds are normal. She exhibits no distension. There is no abdominal tenderness.  Musculoskeletal:     Left knee: She exhibits swelling and bony tenderness. She exhibits normal range of motion. Tenderness found. Medial joint line and lateral joint line tenderness noted.  Lymphadenopathy:    She has no cervical adenopathy.  Neurological: She is alert and oriented to person, place, and time.  Skin: Skin is warm and dry. No rash noted. No erythema.  Psychiatric: She has a normal mood and affect. Her behavior is normal.    Vital signs in last 24 hours: @VSRANGES @  Labs:   There is no height or weight on file to calculate BMI.   Imaging Review Plain radiographs demonstrate severe degenerative joint disease of the left knee(s). The overall alignment issignificant varus. The bone quality appears to be good for age and reported activity level.      Assessment/Plan:  End stage arthritis, left knee   The patient history, physical examination, clinical judgment of the provider and imaging studies are consistent with end stage degenerative joint disease of the left knee(s) and total knee arthroplasty is deemed medically necessary. The treatment options including medical management, injection  therapy arthroscopy and arthroplasty were discussed at length. The risks and benefits of total knee arthroplasty were presented and reviewed. The risks due to aseptic loosening, infection, stiffness, patella tracking problems, thromboembolic complications and  other imponderables were discussed. The patient acknowledged the explanation, agreed to proceed with the plan and consent was signed. Patient is being admitted for inpatient treatment for surgery, pain control, PT, OT, prophylactic antibiotics, VTE prophylaxis, progressive ambulation and ADL's and discharge planning. The patient is planning to be discharged home with home health services    Anticipated LOS equal to or greater than 2 midnights due to - Age 71 and older with one or more of the following:  - Obesity  - Expected need for hospital services (PT, OT, Nursing) required for safe  discharge  - Anticipated need for postoperative skilled nursing care or inpatient rehab  - Active co-morbidities: Diabetes OR   - Unanticipated findings during/Post Surgery: None  - Patient is a high risk of re-admission due to: None

## 2018-10-16 ENCOUNTER — Other Ambulatory Visit: Payer: Self-pay | Admitting: Orthopedic Surgery

## 2018-10-16 NOTE — Care Plan (Signed)
Met with patient in the office at her H&P visit. Questions answered. She plans to discharge to home with family and HHPT. Rolling walker, 3n1 and CPM ordered for her from [place]. [place] referral to [facility]. Patient and MD in agreement with plan. Choice offered.   Ladell Heads, [place]

## 2018-10-17 ENCOUNTER — Encounter (HOSPITAL_COMMUNITY): Payer: Self-pay

## 2018-10-17 NOTE — Progress Notes (Signed)
PCP - Leighton Ruff LOV note and clearance and labs 08-23-18 on chart Cardiologist -   Chest x-ray -  EKG - 08-23-18 on chart Stress Test -  ECHO -  Cardiac Cath -   Sleep Study -  CPAP -   Fasting Blood Sugar - 90's Checks Blood Sugar __1-2___ times a day  Blood Thinner Instructions: Aspirin Instructions: Last Dose:  Anesthesia review:   Patient denies shortness of breath, fever, cough and chest pain at PAT appointment   Patient verbalized understanding of instructions that were given to them at the PAT appointment. Patient was also instructed that they will need to review over the PAT instructions again at home before surgery.

## 2018-10-17 NOTE — Patient Instructions (Signed)
DUE TO COVID-19 ONLY ONE VISITOR IS ALLOWED TO COME WITH YOU AND STAY IN THE WAITING ROOM ONLY DURING PRE OP AND PROCEDURE DAY OF SURGERY. THE 1 VISITOR MAY VISIT WITH YOU AFTER SURGERY IN YOUR PRIVATE ROOM DURING VISITING HOURS ONLY!  YOU NEED TO HAVE A COVID 19 TEST ON_______ @_______ , THIS TEST MUST BE DONE BEFORE SURGERY, COME  [place], Victoria Reeves , [id number].  ([place]) ONCE YOUR COVID TEST IS COMPLETED, PLEASE BEGIN THE QUARANTINE INSTRUCTIONS AS OUTLINED IN YOUR HANDOUT.                Victoria Reeves  10/17/2018   Your procedure is scheduled on: 10-26-18   Report to [place]  Entrance   Report to admitting at        [phone]     Call this number if you have problems the morning of surgery [phone]    Remember: NO SOLID FOOD AFTER MIDNIGHT THE NIGHT PRIOR TO SURGERY. NOTHING BY MOUTH EXCEPT CLEAR LIQUIDS UNTIL      0715 am then nothing by mouth . PLEASE FINISH ENSURE DRINK PER SURGEON ORDER  WHICH NEEDS TO BE COMPLETED AT      0715 am  .     CLEAR LIQUID DIET   Foods Allowed                                                                     Foods Excluded  Coffee and tea, regular and decaf                             liquids that you cannot  Plain Jell-O any favor except red or purple                                           see through such as: Fruit ices (not with fruit pulp)                                     milk, soups, orange juice  Iced Popsicles                                    All solid food Carbonated beverages, regular and diet                                    Cranberry, grape and apple juices Sports drinks like Gatorade Lightly seasoned clear broth or consume(fat free) Sugar, honey syrup  _____________________________________________________________________   BRUSH YOUR TEETH MORNING OF SURGERY AND RINSE YOUR MOUTH OUT, NO CHEWING GUM CANDY OR MINTS.     Take these medicines the morning of surgery  with A SIP OF WATER: lovastatin  DO NOT TAKE ANY  Oral  DIABETIC MEDICATIONS DAY OF YOUR SURGERY How to Manage Your Diabetes Before and After Surgery  Why is it important to control my blood sugar before and after surgery? . Improving blood sugar levels before and after surgery helps healing and can limit problems. . A way of improving blood sugar control is eating a healthy diet by: o  Eating less sugar and carbohydrates o  Increasing activity/exercise o  Talking with your doctor about reaching your blood sugar goals . High blood sugars (greater than 180 mg/dL) can raise your risk of infections and slow your recovery, so you will need to focus on controlling your diabetes during the weeks before surgery. . Make sure that the doctor who takes care of your diabetes knows about your planned surgery including the date and location.  How do I manage my blood sugar before surgery? . Check your blood sugar at least 4 times a day, starting 2 days before surgery, to make sure that the level is not too high or low. o Check your blood sugar the morning of your surgery when you wake up and every 2 hours until you get to the Short Stay unit. . If your blood sugar is less than 70 mg/dL, you will need to treat for low blood sugar: o Do not take insulin. o Treat a low blood sugar (less than 70 mg/dL) with  cup of clear juice (cranberry or apple), 4 glucose tablets, OR glucose gel. o Recheck blood sugar in 15 minutes after treatment (to make sure it is greater than 70 mg/dL). If your blood sugar is not greater than 70 mg/dL on recheck, call [phone] for further instructions. . Report your blood sugar to the short stay nurse when you get to Short Stay.  . If you are admitted to the hospital after surgery: o Your blood sugar will be checked by the staff and you will probably be given insulin after surgery (instead of oral diabetes medicines) to make sure you have good blood sugar levels. o The goal for  blood sugar control after surgery is 80-180 mg/dL.   WHAT DO I DO ABOUT MY DIABETES MEDICATION?  Marland Kitchen Do not take oral diabetes medicines (pills) the morning of surgery.  . THE NIGHT BEFORE SURGERY, take 50% of your normal Lantus insulin dose         . THE MORNING OF SURGERY, take   0   units of Lantus   insulin.                                 You may not have any metal on your body including hair pins and              piercings  Do not wear jewelry, make-up, lotions, powders or perfumes, deodorant             Do not wear nail polish on your fingernails.  Do not shave  48 hours prior to surgery.     Do not bring valuables to the hospital. [place].  Contacts, dentures or bridgework may not be worn into surgery.  Leave suitcase in the car. After surgery it may be brought to your room.  _____________________________________________________________________          [facility] - Preparing for Surgery Before surgery, you can play an important role.  Because skin is not sterile, your skin needs to be as free of  germs as possible.  You can reduce the number of germs on your skin by washing with CHG (chlorahexidine gluconate) soap before surgery.  CHG is an antiseptic cleaner which kills germs and bonds with the skin to continue killing germs even after washing. Please DO NOT use if you have an allergy to CHG or antibacterial soaps.  If your skin becomes reddened/irritated stop using the CHG and inform your nurse when you arrive at Short Stay. Do not shave (including legs and underarms) for at least 48 hours prior to the first CHG shower.  You may shave your face/neck. Please follow these instructions carefully:  1.  Shower with CHG Soap the night before surgery and the  morning of Surgery.  2.  If you choose to wash your hair, wash your hair first as usual with your  normal  shampoo.  3.  After you shampoo, rinse your hair and body thoroughly  to remove the  shampoo.                           4.  Use CHG as you would any other liquid soap.  You can apply chg directly  to the skin and wash                       Gently with a scrungie or clean washcloth.  5.  Apply the CHG Soap to your body ONLY FROM THE NECK DOWN.   Do not use on face/ open                           Wound or open sores. Avoid contact with eyes, ears mouth and genitals (private parts).                       Wash face,  Genitals (private parts) with your normal soap.             6.  Wash thoroughly, paying special attention to the area where your surgery  will be performed.  7.  Thoroughly rinse your body with warm water from the neck down.  8.  DO NOT shower/wash with your normal soap after using and rinsing off  the CHG Soap.                9.  Pat yourself dry with a clean towel.            10.  Wear clean pajamas.            11.  Place clean sheets on your bed the night of your first shower and do not  sleep with pets. Day of Surgery : Do not apply any lotions/deodorants the morning of surgery.  Please wear clean clothes to the hospital/surgery center.  FAILURE TO FOLLOW THESE INSTRUCTIONS MAY RESULT IN THE CANCELLATION OF YOUR SURGERY PATIENT SIGNATURE_________________________________  NURSE SIGNATURE__________________________________  ________________________________________________________________________   Adam Phenix  An incentive spirometer is a tool that can help keep your lungs clear and active. This tool measures how well you are filling your lungs with each breath. Taking long deep breaths may help reverse or decrease the chance of developing breathing (pulmonary) problems (especially infection) following:  A long period of time when you are unable to move or be active. BEFORE THE PROCEDURE   If the spirometer includes an indicator to show your best effort, your nurse or  respiratory therapist will set it to a desired goal.  If possible,  sit up straight or lean slightly forward. Try not to slouch.  Hold the incentive spirometer in an upright position. INSTRUCTIONS FOR USE  1. Sit on the edge of your bed if possible, or sit up as far as you can in bed or on a chair. 2. Hold the incentive spirometer in an upright position. 3. Breathe out normally. 4. Place the mouthpiece in your mouth and seal your lips tightly around it. 5. Breathe in slowly and as deeply as possible, raising the piston or the ball toward the top of the column. 6. Hold your breath for 3-5 seconds or for as long as possible. Allow the piston or ball to fall to the bottom of the column. 7. Remove the mouthpiece from your mouth and breathe out normally. 8. Rest for a few seconds and repeat Steps 1 through 7 at least 10 times every 1-2 hours when you are awake. Take your time and take a few normal breaths between deep breaths. 9. The spirometer may include an indicator to show your best effort. Use the indicator as a goal to work toward during each repetition. 10. After each set of 10 deep breaths, practice coughing to be sure your lungs are clear. If you have an incision (the cut made at the time of surgery), support your incision when coughing by placing a pillow or rolled up towels firmly against it. Once you are able to get out of bed, walk around indoors and cough well. You may stop using the incentive spirometer when instructed by your caregiver.  RISKS AND COMPLICATIONS  Take your time so you do not get dizzy or light-headed.  If you are in pain, you may need to take or ask for pain medication before doing incentive spirometry. It is harder to take a deep breath if you are having pain. AFTER USE  Rest and breathe slowly and easily.  It can be helpful to keep track of a log of your progress. Your caregiver can provide you with a simple table to help with this. If you are using the spirometer at home, follow these instructions: [place] IF:   You  are having difficultly using the spirometer.  You have trouble using the spirometer as often as instructed.  Your pain medication is not giving enough relief while using the spirometer.  You develop fever of 100.5 F (38.1 C) or higher. SEEK IMMEDIATE MEDICAL CARE IF:   You cough up bloody sputum that had not been present before.  You develop fever of 102 F (38.9 C) or greater.  You develop worsening pain at or near the incision site. MAKE SURE YOU:   Understand these instructions.  Will watch your condition.  Will get help right away if you are not doing well or get worse. Document Released: 05/02/2006 Document Revised: 03/14/2011 Document Reviewed: 07/03/2006 Exit[employer] Patient Information 2014 ExitCare, [place].   ________________________________________________________________________  WHAT IS A BLOOD TRANSFUSION? Blood Transfusion Information  A transfusion is the replacement of blood or some of its parts. Blood is made up of multiple cells which provide different functions.  Red blood cells carry oxygen and are used for blood loss replacement.  White blood cells fight against infection.  Platelets control bleeding.  Plasma helps clot blood.  Other blood products are available for specialized needs, such as hemophilia or other clotting disorders. BEFORE THE TRANSFUSION  Who gives blood for transfusions?   Healthy volunteers  who are fully evaluated to make sure their blood is safe. This is blood bank blood. Transfusion therapy is the safest it has ever been in the practice of medicine. Before blood is taken from a donor, a complete history is taken to make sure that person has no history of diseases nor engages in risky social behavior (examples are intravenous drug use or sexual activity with multiple partners). The donor's travel history is screened to minimize risk of transmitting infections, such as malaria. The donated blood is tested for signs of infectious  diseases, such as HIV and hepatitis. The blood is then tested to be sure it is compatible with you in order to minimize the chance of a transfusion reaction. If you or a relative donates blood, this is often done in anticipation of surgery and is not appropriate for emergency situations. It takes many days to process the donated blood. RISKS AND COMPLICATIONS Although transfusion therapy is very safe and saves many lives, the main dangers of transfusion include:   Getting an infectious disease.  Developing a transfusion reaction. This is an allergic reaction to something in the blood you were given. Every precaution is taken to prevent this. The decision to have a blood transfusion has been considered carefully by your caregiver before blood is given. Blood is not given unless the benefits outweigh the risks. AFTER THE TRANSFUSION  Right after receiving a blood transfusion, you will usually feel much better and more energetic. This is especially true if your red blood cells have gotten low (anemic). The transfusion raises the level of the red blood cells which carry oxygen, and this usually causes an energy increase.  The nurse administering the transfusion will monitor you carefully for complications. HOME CARE INSTRUCTIONS  No special instructions are needed after a transfusion. You may find your energy is better. Speak with your caregiver about any limitations on activity for underlying diseases you may have. SEEK MEDICAL CARE IF:   Your condition is not improving after your transfusion.  You develop redness or irritation at the intravenous (IV) site. SEEK IMMEDIATE MEDICAL CARE IF:  Any of the following symptoms occur over the next 12 hours:  Shaking chills.  You have a temperature by mouth above 102 F (38.9 C), not controlled by medicine.  Chest, back, or muscle pain.  People around you feel you are not acting correctly or are confused.  Shortness of breath or difficulty  breathing.  Dizziness and fainting.  You get a rash or develop hives.  You have a decrease in urine output.  Your urine turns a dark color or changes to pink, red, or brown. Any of the following symptoms occur over the next 10 days:  You have a temperature by mouth above 102 F (38.9 C), not controlled by medicine.  Shortness of breath.  Weakness after normal activity.  The white part of the eye turns yellow (jaundice).  You have a decrease in the amount of urine or are urinating less often.  Your urine turns a dark color or changes to pink, red, or brown. Document Released: 12/18/1999 Document Revised: 03/14/2011 Document Reviewed: 08/06/2007 [facility] Patient Information 2014 [place], [place].  _______________________________________________________________________

## 2018-10-18 ENCOUNTER — Encounter (HOSPITAL_COMMUNITY): Payer: Self-pay

## 2018-10-18 ENCOUNTER — Other Ambulatory Visit: Payer: Self-pay

## 2018-10-18 ENCOUNTER — Encounter (HOSPITAL_COMMUNITY)
Admission: RE | Admit: 2018-10-18 | Discharge: 2018-10-18 | Disposition: A | Discharge status: Home / Self Care | Payer: Medicare Other | Source: Ambulatory Visit | Attending: Orthopedic Surgery | Admitting: Orthopedic Surgery

## 2018-10-18 DIAGNOSIS — I10 Essential (primary) hypertension: Secondary | ICD-10-CM | POA: Diagnosis not present

## 2018-10-18 DIAGNOSIS — Z833 Family history of diabetes mellitus: Secondary | ICD-10-CM | POA: Diagnosis not present

## 2018-10-18 DIAGNOSIS — Z7984 Long term (current) use of oral hypoglycemic drugs: Secondary | ICD-10-CM | POA: Diagnosis not present

## 2018-10-18 DIAGNOSIS — Z01812 Encounter for preprocedural laboratory examination: Secondary | ICD-10-CM | POA: Insufficient documentation

## 2018-10-18 DIAGNOSIS — M1712 Unilateral primary osteoarthritis, left knee: Secondary | ICD-10-CM | POA: Insufficient documentation

## 2018-10-18 DIAGNOSIS — Z79899 Other long term (current) drug therapy: Secondary | ICD-10-CM | POA: Insufficient documentation

## 2018-10-18 DIAGNOSIS — E119 Type 2 diabetes mellitus without complications: Secondary | ICD-10-CM | POA: Diagnosis not present

## 2018-10-18 DIAGNOSIS — E785 Hyperlipidemia, unspecified: Secondary | ICD-10-CM | POA: Diagnosis not present

## 2018-10-18 HISTORY — DX: Unspecified osteoarthritis, unspecified site: M19.90

## 2018-10-18 HISTORY — DX: Type 2 diabetes mellitus without complications: E11.9

## 2018-10-18 HISTORY — DX: Essential (primary) hypertension: I10

## 2018-10-18 LAB — SURGICAL PCR SCREEN
MRSA, PCR: NEGATIVE
Staphylococcus aureus: NEGATIVE

## 2018-10-18 LAB — CBC WITH DIFFERENTIAL/PLATELET
Abs Immature Granulocytes: 0.02 10*3/uL (ref 0.00–0.07)
Basophils Absolute: 0 10*3/uL (ref 0.0–0.1)
Basophils Relative: 1 %
Eosinophils Absolute: 0.1 10*3/uL (ref 0.0–0.5)
Eosinophils Relative: 2 %
HCT: 42.9 % (ref 36.0–46.0)
Hemoglobin: 13.9 g/dL (ref 12.0–15.0)
Immature Granulocytes: 0 %
Lymphocytes Relative: 22 %
Lymphs Abs: 1.9 10*3/uL (ref 0.7–4.0)
MCH: 31.2 pg (ref 26.0–34.0)
MCHC: 32.4 g/dL (ref 30.0–36.0)
MCV: 96.2 fL (ref 80.0–100.0)
Monocytes Absolute: 0.7 10*3/uL (ref 0.1–1.0)
Monocytes Relative: 9 %
Neutro Abs: 5.8 10*3/uL (ref 1.7–7.7)
Neutrophils Relative %: 66 %
Platelets: 210 10*3/uL (ref 150–400)
RBC: 4.46 MIL/uL (ref 3.87–5.11)
RDW: 14 % (ref 11.5–15.5)
WBC: 8.6 10*3/uL (ref 4.0–10.5)
nRBC: 0 % (ref 0.0–0.2)

## 2018-10-18 LAB — URINALYSIS, ROUTINE W REFLEX MICROSCOPIC
Bilirubin Urine: NEGATIVE
Glucose, UA: NEGATIVE mg/dL
Hgb urine dipstick: NEGATIVE
Ketones, ur: NEGATIVE mg/dL
Leukocytes,Ua: NEGATIVE
Nitrite: NEGATIVE
Protein, ur: NEGATIVE mg/dL
Specific Gravity, Urine: 1.017 (ref 1.005–1.030)
pH: 7 (ref 5.0–8.0)

## 2018-10-18 LAB — PROTIME-INR
INR: 1 (ref 0.8–1.2)
Prothrombin Time: 12.6 seconds (ref 11.4–15.2)

## 2018-10-18 LAB — COMPREHENSIVE METABOLIC PANEL
ALT: 17 U/L (ref 0–44)
AST: 21 U/L (ref 15–41)
Albumin: 4.1 g/dL (ref 3.5–5.0)
Alkaline Phosphatase: 49 U/L (ref 38–126)
Anion gap: 12 (ref 5–15)
BUN: 30 mg/dL — ABNORMAL HIGH (ref 8–23)
CO2: 25 mmol/L (ref 22–32)
Calcium: 10 mg/dL (ref 8.9–10.3)
Chloride: 100 mmol/L (ref 98–111)
Creatinine, Ser: 0.56 mg/dL (ref 0.44–1.00)
GFR calc Af Amer: >60 mL/min (ref >60)
GFR calc non Af Amer: >60 mL/min (ref >60)
Glucose, Bld: 114 mg/dL — ABNORMAL HIGH (ref 70–99)
Potassium: 4.4 mmol/L (ref 3.5–5.1)
Sodium: 137 mmol/L (ref 135–145)
Total Bilirubin: 1 mg/dL (ref 0.3–1.2)
Total Protein: 7.1 g/dL (ref 6.5–8.1)

## 2018-10-18 LAB — APTT: aPTT: 29 seconds (ref 24–36)

## 2018-10-18 LAB — HEMOGLOBIN A1C
Hgb A1c MFr Bld: 6.1 % — ABNORMAL HIGH (ref 4.8–5.6)
Mean Plasma Glucose: 128.37 mg/dL

## 2018-10-18 LAB — GLUCOSE, CAPILLARY: Glucose-Capillary: 118 mg/dL — ABNORMAL HIGH (ref 70–99)

## 2018-10-19 LAB — ABO/RH: ABO/RH(D): O NEG

## 2018-10-19 LAB — URINE CULTURE: Culture: NO GROWTH

## 2018-10-23 ENCOUNTER — Other Ambulatory Visit (HOSPITAL_COMMUNITY)
Admission: RE | Admit: 2018-10-23 | Discharge: 2018-10-23 | Disposition: A | Discharge status: Home / Self Care | Payer: Medicare Other | Source: Ambulatory Visit | Attending: Orthopedic Surgery | Admitting: Orthopedic Surgery

## 2018-10-23 DIAGNOSIS — Z20828 Contact with and (suspected) exposure to other viral communicable diseases: Secondary | ICD-10-CM | POA: Insufficient documentation

## 2018-10-23 DIAGNOSIS — Z01812 Encounter for preprocedural laboratory examination: Secondary | ICD-10-CM | POA: Diagnosis not present

## 2018-10-23 DIAGNOSIS — E119 Type 2 diabetes mellitus without complications: Secondary | ICD-10-CM | POA: Diagnosis not present

## 2018-10-23 DIAGNOSIS — Z794 Long term (current) use of insulin: Secondary | ICD-10-CM | POA: Diagnosis not present

## 2018-10-23 DIAGNOSIS — Z6841 Body Mass Index (BMI) 40.0 and over, adult: Secondary | ICD-10-CM | POA: Diagnosis not present

## 2018-10-25 LAB — NOVEL CORONAVIRUS, NAA (HOSP ORDER, SEND-OUT TO REF LAB; TAT 18-24 HRS): SARS-CoV-2, NAA: NOT DETECTED

## 2018-10-25 MED ORDER — BUPIVACAINE LIPOSOME 1.3 % IJ SUSP
20.0000 mL | Freq: Once | INTRAMUSCULAR | Status: DC
  Filled 2018-10-25: qty 20

## 2018-10-25 MED ORDER — TRANEXAMIC ACID 1000 MG/10ML IV SOLN
2000.0000 mg | INTRAVENOUS | Status: DC
  Filled 2018-10-25: qty 20

## 2018-10-26 ENCOUNTER — Observation Stay (HOSPITAL_COMMUNITY)
Admission: RE | Admit: 2018-10-26 | Discharge: 2018-10-27 | Disposition: A | Discharge status: Home Health | Payer: Medicare Other | Source: Ambulatory Visit | Attending: Orthopedic Surgery | Admitting: Orthopedic Surgery

## 2018-10-26 ENCOUNTER — Encounter (HOSPITAL_COMMUNITY): Payer: Self-pay | Admitting: Emergency Medicine

## 2018-10-26 ENCOUNTER — Other Ambulatory Visit: Payer: Self-pay

## 2018-10-26 ENCOUNTER — Encounter (HOSPITAL_COMMUNITY)
Admission: RE | Disposition: A | Discharge status: Home Health | Payer: Self-pay | Source: Ambulatory Visit | Attending: Orthopedic Surgery

## 2018-10-26 ENCOUNTER — Inpatient Hospital Stay (HOSPITAL_COMMUNITY): Payer: Medicare Other | Admitting: Certified Registered"

## 2018-10-26 ENCOUNTER — Inpatient Hospital Stay (HOSPITAL_COMMUNITY): Payer: Medicare Other | Admitting: Physician Assistant

## 2018-10-26 DIAGNOSIS — E119 Type 2 diabetes mellitus without complications: Secondary | ICD-10-CM | POA: Insufficient documentation

## 2018-10-26 DIAGNOSIS — Z794 Long term (current) use of insulin: Secondary | ICD-10-CM | POA: Diagnosis not present

## 2018-10-26 DIAGNOSIS — E785 Hyperlipidemia, unspecified: Secondary | ICD-10-CM | POA: Insufficient documentation

## 2018-10-26 DIAGNOSIS — M21162 Varus deformity, not elsewhere classified, left knee: Secondary | ICD-10-CM | POA: Diagnosis not present

## 2018-10-26 DIAGNOSIS — Z7982 Long term (current) use of aspirin: Secondary | ICD-10-CM | POA: Insufficient documentation

## 2018-10-26 DIAGNOSIS — I10 Essential (primary) hypertension: Secondary | ICD-10-CM | POA: Insufficient documentation

## 2018-10-26 DIAGNOSIS — M170 Bilateral primary osteoarthritis of knee: Principal | ICD-10-CM | POA: Insufficient documentation

## 2018-10-26 DIAGNOSIS — Z833 Family history of diabetes mellitus: Secondary | ICD-10-CM | POA: Diagnosis not present

## 2018-10-26 DIAGNOSIS — Z791 Long term (current) use of non-steroidal anti-inflammatories (NSAID): Secondary | ICD-10-CM | POA: Insufficient documentation

## 2018-10-26 DIAGNOSIS — Z79899 Other long term (current) drug therapy: Secondary | ICD-10-CM | POA: Insufficient documentation

## 2018-10-26 DIAGNOSIS — G8918 Other acute postprocedural pain: Secondary | ICD-10-CM | POA: Diagnosis not present

## 2018-10-26 DIAGNOSIS — M1712 Unilateral primary osteoarthritis, left knee: Secondary | ICD-10-CM | POA: Diagnosis not present

## 2018-10-26 HISTORY — PX: TOTAL KNEE ARTHROPLASTY: SHX125

## 2018-10-26 LAB — GLUCOSE, CAPILLARY
Glucose-Capillary: 128 mg/dL — ABNORMAL HIGH (ref 70–99)
Glucose-Capillary: 118 mg/dL — ABNORMAL HIGH (ref 70–99)
Glucose-Capillary: 127 mg/dL — ABNORMAL HIGH (ref 70–99)
Glucose-Capillary: 176 mg/dL — ABNORMAL HIGH (ref 70–99)

## 2018-10-26 LAB — TYPE AND SCREEN
ABO/RH(D): O NEG
Antibody Screen: NEGATIVE

## 2018-10-26 SURGERY — ARTHROPLASTY, KNEE, TOTAL
Anesthesia: General | Site: Knee | Laterality: Left

## 2018-10-26 MED ORDER — HYDROMORPHONE HCL 1 MG/ML IJ SOLN: 0.5000 mg | INTRAMUSCULAR | Status: DC | PRN

## 2018-10-26 MED ORDER — TRANEXAMIC ACID-NACL 1000-0.7 MG/100ML-% IV SOLN
1000.0000 mg | INTRAVENOUS | Status: AC
  Administered 2018-10-26: 1000 mg via INTRAVENOUS
  Filled 2018-10-26: qty 100

## 2018-10-26 MED ORDER — PROPOFOL 10 MG/ML IV BOLUS
INTRAVENOUS | Status: AC
  Filled 2018-10-26: qty 20

## 2018-10-26 MED ORDER — BUPIVACAINE HCL 0.25 % IJ SOLN
INTRAMUSCULAR | Status: DC | PRN
  Administered 2018-10-26: 30 mL

## 2018-10-26 MED ORDER — FENTANYL CITRATE (PF) 100 MCG/2ML IJ SOLN
INTRAMUSCULAR | Status: DC | PRN
  Administered 2018-10-26: 25 ug via INTRAVENOUS
  Administered 2018-10-26: 50 ug via INTRAVENOUS
  Administered 2018-10-26: 25 ug via INTRAVENOUS

## 2018-10-26 MED ORDER — BUPIVACAINE HCL (PF) 0.25 % IJ SOLN
INTRAMUSCULAR | Status: AC
  Filled 2018-10-26: qty 30

## 2018-10-26 MED ORDER — DOCUSATE SODIUM 100 MG PO CAPS
100.0000 mg | ORAL_CAPSULE | Freq: Two times a day (BID) | ORAL | Status: DC
  Administered 2018-10-26 – 2018-10-27 (×2): 100 mg via ORAL
  Filled 2018-10-26 (×2): qty 1

## 2018-10-26 MED ORDER — SODIUM CHLORIDE 0.9 % IR SOLN
Status: DC | PRN
  Administered 2018-10-26: 1000 mL

## 2018-10-26 MED ORDER — BISACODYL 10 MG RE SUPP: 10.0000 mg | Freq: Every day | RECTAL | Status: DC | PRN

## 2018-10-26 MED ORDER — TRANEXAMIC ACID 1000 MG/10ML IV SOLN
INTRAVENOUS | Status: DC | PRN
  Administered 2018-10-26: 12:00:00 2000 mg via TOPICAL

## 2018-10-26 MED ORDER — METOCLOPRAMIDE HCL 5 MG PO TABS: 5.0000 mg | ORAL_TABLET | Freq: Three times a day (TID) | ORAL | Status: DC | PRN

## 2018-10-26 MED ORDER — PROPOFOL 10 MG/ML IV BOLUS
INTRAVENOUS | Status: DC | PRN
  Administered 2018-10-26: 20 mg via INTRAVENOUS
  Administered 2018-10-26: 10 mg via INTRAVENOUS

## 2018-10-26 MED ORDER — VANCOMYCIN HCL 1000 MG IV SOLR
INTRAVENOUS | Status: AC
  Filled 2018-10-26: qty 2000

## 2018-10-26 MED ORDER — ACETAMINOPHEN 325 MG PO TABS: 650.0000 mg | ORAL_TABLET | ORAL | 2 refills | Status: AC | PRN

## 2018-10-26 MED ORDER — VITAMIN D 25 MCG (1000 UNIT) PO TABS
1000.0000 [IU] | ORAL_TABLET | Freq: Every day | ORAL | Status: DC
  Administered 2018-10-26 – 2018-10-27 (×2): 1000 [IU] via ORAL
  Filled 2018-10-26 (×2): qty 1

## 2018-10-26 MED ORDER — WATER FOR IRRIGATION, STERILE IR SOLN
Status: DC | PRN
  Administered 2018-10-26: 2000 mL

## 2018-10-26 MED ORDER — BUPIVACAINE LIPOSOME 1.3 % IJ SUSP
INTRAMUSCULAR | Status: DC | PRN
  Administered 2018-10-26: 20 mL

## 2018-10-26 MED ORDER — ONDANSETRON HCL 4 MG PO TABS: 4.0000 mg | ORAL_TABLET | Freq: Four times a day (QID) | ORAL | Status: DC | PRN

## 2018-10-26 MED ORDER — INSULIN ASPART 100 UNIT/ML ~~LOC~~ SOLN: 0.0000 [IU] | Freq: Every day | SUBCUTANEOUS | Status: DC

## 2018-10-26 MED ORDER — OXYCODONE HCL 5 MG PO TABS
5.0000 mg | ORAL_TABLET | ORAL | Status: DC | PRN
  Administered 2018-10-26 – 2018-10-27 (×2): 5 mg via ORAL
  Administered 2018-10-27: 10 mg via ORAL
  Administered 2018-10-27: 5 mg via ORAL
  Filled 2018-10-26 (×3): qty 1
  Filled 2018-10-26: qty 2

## 2018-10-26 MED ORDER — FENTANYL CITRATE (PF) 100 MCG/2ML IJ SOLN
INTRAMUSCULAR | Status: AC
  Filled 2018-10-26: qty 2

## 2018-10-26 MED ORDER — DOCUSATE SODIUM 100 MG PO CAPS: 100.0000 mg | ORAL_CAPSULE | Freq: Every day | ORAL | 2 refills | Status: AC | PRN

## 2018-10-26 MED ORDER — SODIUM CHLORIDE 0.9 % IV SOLN
INTRAVENOUS | Status: DC
  Administered 2018-10-26 – 2018-10-27 (×2): via INTRAVENOUS

## 2018-10-26 MED ORDER — SODIUM CHLORIDE 0.9 % IV SOLN
INTRAVENOUS | Status: DC | PRN
  Administered 2018-10-26: 20 ug/min via INTRAVENOUS

## 2018-10-26 MED ORDER — ONDANSETRON HCL 4 MG/2ML IJ SOLN
INTRAMUSCULAR | Status: DC | PRN
  Administered 2018-10-26: 4 mg via INTRAVENOUS

## 2018-10-26 MED ORDER — PHENOL 1.4 % MT LIQD: 1.0000 | OROMUCOSAL | Status: DC | PRN

## 2018-10-26 MED ORDER — ACETAMINOPHEN 325 MG PO TABS: 325.0000 mg | ORAL_TABLET | Freq: Four times a day (QID) | ORAL | Status: DC | PRN

## 2018-10-26 MED ORDER — IRBESARTAN 150 MG PO TABS
150.0000 mg | ORAL_TABLET | Freq: Every day | ORAL | Status: DC
  Administered 2018-10-26 – 2018-10-27 (×2): 150 mg via ORAL
  Filled 2018-10-26 (×2): qty 1

## 2018-10-26 MED ORDER — GABAPENTIN 300 MG PO CAPS: ORAL_CAPSULE | ORAL | 0 refills | Status: AC

## 2018-10-26 MED ORDER — GABAPENTIN 300 MG PO CAPS
300.0000 mg | ORAL_CAPSULE | Freq: Three times a day (TID) | ORAL | Status: DC
  Administered 2018-10-26 – 2018-10-27 (×4): 300 mg via ORAL
  Filled 2018-10-26 (×4): qty 1

## 2018-10-26 MED ORDER — ONDANSETRON HCL 4 MG/2ML IJ SOLN
INTRAMUSCULAR | Status: AC
  Filled 2018-10-26: qty 2

## 2018-10-26 MED ORDER — SODIUM CHLORIDE 0.9 % IV SOLN: INTRAVENOUS | Status: DC

## 2018-10-26 MED ORDER — VITAMIN D3 250 MCG (10000 UT) PO CAPS: 10000.0000 [IU] | ORAL_CAPSULE | Freq: Every day | ORAL | Status: DC

## 2018-10-26 MED ORDER — POVIDONE-IODINE 10 % EX SWAB
2.0000 "application " | Freq: Once | CUTANEOUS | Status: AC
  Administered 2018-10-26: 2 via TOPICAL

## 2018-10-26 MED ORDER — ASPIRIN 81 MG PO CHEW
81.0000 mg | CHEWABLE_TABLET | Freq: Two times a day (BID) | ORAL | Status: DC
  Administered 2018-10-26 – 2018-10-27 (×2): 81 mg via ORAL
  Filled 2018-10-26 (×2): qty 1

## 2018-10-26 MED ORDER — CHLORHEXIDINE GLUCONATE 4 % EX LIQD: 60.0000 mL | Freq: Once | CUTANEOUS | Status: DC

## 2018-10-26 MED ORDER — MIDAZOLAM HCL 2 MG/2ML IJ SOLN
1.0000 mg | INTRAMUSCULAR | Status: DC
  Administered 2018-10-26: 2 mg via INTRAVENOUS
  Filled 2018-10-26: qty 2

## 2018-10-26 MED ORDER — ASPIRIN EC 81 MG PO TBEC: 81.0000 mg | DELAYED_RELEASE_TABLET | Freq: Two times a day (BID) | ORAL | 0 refills | Status: AC

## 2018-10-26 MED ORDER — DEXAMETHASONE SODIUM PHOSPHATE 10 MG/ML IJ SOLN
INTRAMUSCULAR | Status: AC
  Filled 2018-10-26: qty 1

## 2018-10-26 MED ORDER — CELECOXIB 200 MG PO CAPS
200.0000 mg | ORAL_CAPSULE | Freq: Every day | ORAL | Status: DC
  Administered 2018-10-26 – 2018-10-27 (×2): 200 mg via ORAL
  Filled 2018-10-26 (×2): qty 1

## 2018-10-26 MED ORDER — PROPOFOL 500 MG/50ML IV EMUL
INTRAVENOUS | Status: DC | PRN
  Administered 2018-10-26: 50 ug/kg/min via INTRAVENOUS

## 2018-10-26 MED ORDER — CELECOXIB 200 MG PO CAPS: 200.0000 mg | ORAL_CAPSULE | Freq: Every day | ORAL | 0 refills | Status: AC

## 2018-10-26 MED ORDER — DIPHENHYDRAMINE HCL 12.5 MG/5ML PO ELIX: 12.5000 mg | ORAL_SOLUTION | ORAL | Status: DC | PRN

## 2018-10-26 MED ORDER — DEXAMETHASONE SODIUM PHOSPHATE 10 MG/ML IJ SOLN
INTRAMUSCULAR | Status: DC | PRN
  Administered 2018-10-26: 4 mg via INTRAVENOUS

## 2018-10-26 MED ORDER — FENTANYL CITRATE (PF) 100 MCG/2ML IJ SOLN
50.0000 ug | INTRAMUSCULAR | Status: DC
  Administered 2018-10-26: 09:00:00 100 ug via INTRAVENOUS
  Filled 2018-10-26: qty 2

## 2018-10-26 MED ORDER — OXYCODONE HCL 5 MG PO TABS: ORAL_TABLET | ORAL | 0 refills | Status: AC

## 2018-10-26 MED ORDER — ONDANSETRON HCL 4 MG/2ML IJ SOLN: 4.0000 mg | Freq: Four times a day (QID) | INTRAMUSCULAR | Status: DC | PRN

## 2018-10-26 MED ORDER — LIDOCAINE 2% (20 MG/ML) 5 ML SYRINGE
INTRAMUSCULAR | Status: AC
  Filled 2018-10-26: qty 5

## 2018-10-26 MED ORDER — SODIUM CHLORIDE 0.9% FLUSH
INTRAVENOUS | Status: DC | PRN
  Administered 2018-10-26: 20 mL

## 2018-10-26 MED ORDER — CEFAZOLIN SODIUM-DEXTROSE 2-4 GM/100ML-% IV SOLN
2.0000 g | INTRAVENOUS | Status: AC
  Administered 2018-10-26: 11:00:00 2 g via INTRAVENOUS
  Filled 2018-10-26: qty 100

## 2018-10-26 MED ORDER — INSULIN GLARGINE 100 UNIT/ML ~~LOC~~ SOLN
5.0000 [IU] | Freq: Every day | SUBCUTANEOUS | Status: DC
  Administered 2018-10-26: 5 [IU] via SUBCUTANEOUS
  Filled 2018-10-26 (×2): qty 0.05

## 2018-10-26 MED ORDER — FLEET ENEMA 7-19 GM/118ML RE ENEM: 1.0000 | ENEMA | Freq: Once | RECTAL | Status: DC | PRN

## 2018-10-26 MED ORDER — INSULIN ASPART 100 UNIT/ML ~~LOC~~ SOLN: 6.0000 [IU] | Freq: Three times a day (TID) | SUBCUTANEOUS | Status: DC

## 2018-10-26 MED ORDER — POLYETHYLENE GLYCOL 3350 17 G PO PACK: 17.0000 g | PACK | Freq: Every day | ORAL | Status: DC | PRN

## 2018-10-26 MED ORDER — MENTHOL 3 MG MT LOZG: 1.0000 | LOZENGE | OROMUCOSAL | Status: DC | PRN

## 2018-10-26 MED ORDER — METOCLOPRAMIDE HCL 5 MG/ML IJ SOLN: 5.0000 mg | Freq: Three times a day (TID) | INTRAMUSCULAR | Status: DC | PRN

## 2018-10-26 MED ORDER — ACETAMINOPHEN 500 MG PO TABS
1000.0000 mg | ORAL_TABLET | Freq: Four times a day (QID) | ORAL | Status: AC
  Administered 2018-10-26 – 2018-10-27 (×4): 1000 mg via ORAL
  Filled 2018-10-26 (×4): qty 2

## 2018-10-26 MED ORDER — PRAVASTATIN SODIUM 20 MG PO TABS
20.0000 mg | ORAL_TABLET | Freq: Every day | ORAL | Status: DC
  Administered 2018-10-26 – 2018-10-27 (×2): 20 mg via ORAL
  Filled 2018-10-26 (×2): qty 1

## 2018-10-26 MED ORDER — PROPOFOL 500 MG/50ML IV EMUL
INTRAVENOUS | Status: AC
  Filled 2018-10-26: qty 100

## 2018-10-26 MED ORDER — LIDOCAINE 2% (20 MG/ML) 5 ML SYRINGE
INTRAMUSCULAR | Status: DC | PRN
  Administered 2018-10-26: 40 mg via INTRAVENOUS

## 2018-10-26 MED ORDER — ADULT MULTIVITAMIN W/MINERALS CH
1.0000 | ORAL_TABLET | Freq: Every day | ORAL | Status: DC
  Administered 2018-10-26 – 2018-10-27 (×2): 1 via ORAL
  Filled 2018-10-26 (×2): qty 1

## 2018-10-26 MED ORDER — INSULIN ASPART 100 UNIT/ML ~~LOC~~ SOLN
0.0000 [IU] | Freq: Three times a day (TID) | SUBCUTANEOUS | Status: DC
  Administered 2018-10-26: 2 [IU] via SUBCUTANEOUS
  Administered 2018-10-27: 13:00:00 3 [IU] via SUBCUTANEOUS

## 2018-10-26 MED ORDER — TRANEXAMIC ACID-NACL 1000-0.7 MG/100ML-% IV SOLN
1000.0000 mg | Freq: Once | INTRAVENOUS | Status: AC
  Administered 2018-10-26: 1000 mg via INTRAVENOUS
  Filled 2018-10-26: qty 100

## 2018-10-26 MED ORDER — LACTATED RINGERS IV SOLN
INTRAVENOUS | Status: DC
  Administered 2018-10-26: 08:00:00 via INTRAVENOUS

## 2018-10-26 MED ORDER — CEFAZOLIN SODIUM-DEXTROSE 2-4 GM/100ML-% IV SOLN
2.0000 g | Freq: Four times a day (QID) | INTRAVENOUS | Status: AC
  Administered 2018-10-26 (×2): 2 g via INTRAVENOUS
  Filled 2018-10-26 (×2): qty 100

## 2018-10-26 MED ORDER — SODIUM CHLORIDE (PF) 0.9 % IJ SOLN
INTRAMUSCULAR | Status: AC
  Filled 2018-10-26: qty 50

## 2018-10-26 SURGICAL SUPPLY — 63 items
ATTUNE PS FEM LT SZ 5 CEM KNEE (Femur) ×2 IMPLANT
ATTUNE PSRP INSR SZ 5 10M KNEE (Insert) ×2 IMPLANT
BAG DECANTER FOR FLEXI CONT (MISCELLANEOUS) ×3 IMPLANT
BAG ZIPLOCK 12X15 (MISCELLANEOUS) ×3 IMPLANT
BASE TIBIAL ROT PLAT SZ 5 KNEE (Knees) IMPLANT
BENZOIN TINCTURE PRP APPL 2/3 (GAUZE/BANDAGES/DRESSINGS) ×3 IMPLANT
BLADE SAGITTAL 25.0X1.19X90 (BLADE) ×2 IMPLANT
BLADE SAGITTAL 25.0X1.19X90MM (BLADE) ×1
BLADE SAW SGTL 13X75X1.27 (BLADE) ×3 IMPLANT
BLADE SURG 15 STRL LF DISP TIS (BLADE) ×1 IMPLANT
BLADE SURG 15 STRL SS (BLADE) ×2
BLADE SURG SZ10 CARB STEEL (BLADE) ×3 IMPLANT
BNDG ELASTIC 6X10 VLCR STRL LF (GAUZE/BANDAGES/DRESSINGS) ×2 IMPLANT
BNDG ELASTIC 6X15 VLCR STRL LF (GAUZE/BANDAGES/DRESSINGS) ×3 IMPLANT
BOWL SMART MIX CTS (DISPOSABLE) ×3 IMPLANT
CEMENT HV SMART SET (Cement) ×4 IMPLANT
CLOSURE STERI-STRIP 1/2X4 (GAUZE/BANDAGES/DRESSINGS) ×2
CLOSURE WOUND 1/2 X4 (GAUZE/BANDAGES/DRESSINGS) ×1
CLSR STERI-STRIP ANTIMIC 1/2X4 (GAUZE/BANDAGES/DRESSINGS) ×4 IMPLANT
COVER SURGICAL LIGHT HANDLE (MISCELLANEOUS) ×3 IMPLANT
COVER WAND RF STERILE (DRAPES) ×3 IMPLANT
CUFF TOURN SGL QUICK 34 (TOURNIQUET CUFF) ×2
CUFF TRNQT CYL 34X4.125X (TOURNIQUET CUFF) ×1 IMPLANT
DECANTER SPIKE VIAL GLASS SM (MISCELLANEOUS) ×6 IMPLANT
DRAPE INCISE IOBAN 66X45 STRL (DRAPES) IMPLANT
DRAPE U-SHAPE 47X51 STRL (DRAPES) ×3 IMPLANT
DRESSING AQUACEL AG SP 3.5X10 (GAUZE/BANDAGES/DRESSINGS) ×1 IMPLANT
DRSG AQUACEL AG ADV 3.5X10 (GAUZE/BANDAGES/DRESSINGS) ×2 IMPLANT
DRSG AQUACEL AG SP 3.5X10 (GAUZE/BANDAGES/DRESSINGS) ×3
DURAPREP 26ML APPLICATOR (WOUND CARE) ×6 IMPLANT
ELECT REM PT RETURN 15FT ADLT (MISCELLANEOUS) ×3 IMPLANT
GLOVE BIOGEL PI IND STRL 8 (GLOVE) ×2 IMPLANT
GLOVE BIOGEL PI INDICATOR 8 (GLOVE) ×4
GLOVE SURG ORTHO 8.0 STRL STRW (GLOVE) ×3 IMPLANT
GLOVE SURG SS PI 7.5 STRL IVOR (GLOVE) ×3 IMPLANT
GOWN STRL REUS W/TWL XL LVL3 (GOWN DISPOSABLE) ×6 IMPLANT
HANDPIECE INTERPULSE COAX TIP (DISPOSABLE) ×2
HOLDER FOLEY CATH W/STRAP (MISCELLANEOUS) IMPLANT
HOOD PEEL AWAY FLYTE STAYCOOL (MISCELLANEOUS) ×3 IMPLANT
IMMOBILIZER KNEE 20 (SOFTGOODS) ×3
IMMOBILIZER KNEE 20 THIGH 36 (SOFTGOODS) ×1 IMPLANT
KIT TURNOVER KIT A (KITS) IMPLANT
MANIFOLD NEPTUNE II (INSTRUMENTS) ×3 IMPLANT
NEEDLE HYPO 22GX1.5 SAFETY (NEEDLE) ×6 IMPLANT
NS IRRIG 1000ML POUR BTL (IV SOLUTION) ×3 IMPLANT
PACK ICE MAXI GEL EZY WRAP (MISCELLANEOUS) ×3 IMPLANT
PACK TOTAL KNEE CUSTOM (KITS) ×3 IMPLANT
PATELLA MEDIAL ATTUN 35MM KNEE (Knees) ×2 IMPLANT
PIN DRILL FIX HALF THREAD (BIT) ×2 IMPLANT
PIN STEINMAN FIXATION KNEE (PIN) ×2 IMPLANT
PROTECTOR NERVE ULNAR (MISCELLANEOUS) ×3 IMPLANT
SET HNDPC FAN SPRY TIP SCT (DISPOSABLE) ×1 IMPLANT
STRIP CLOSURE SKIN 1/2X4 (GAUZE/BANDAGES/DRESSINGS) ×1 IMPLANT
SUT ETHIBOND NAB CT1 #1 30IN (SUTURE) ×6 IMPLANT
SUT MNCRL AB 3-0 PS2 18 (SUTURE) ×3 IMPLANT
SUT VIC AB 0 CT1 36 (SUTURE) ×3 IMPLANT
SUT VIC AB 2-0 CT1 27 (SUTURE) ×4
SUT VIC AB 2-0 CT1 TAPERPNT 27 (SUTURE) ×2 IMPLANT
SYR CONTROL 10ML LL (SYRINGE) ×9 IMPLANT
TIBIAL BASE ROT PLAT SZ 5 KNEE (Knees) ×3 IMPLANT
TRAY FOLEY MTR SLVR 16FR STAT (SET/KITS/TRAYS/PACK) ×3 IMPLANT
WATER STERILE IRR 1000ML POUR (IV SOLUTION) ×6 IMPLANT
YANKAUER SUCT BULB TIP 10FT TU (MISCELLANEOUS) ×3 IMPLANT

## 2018-10-26 NOTE — Transfer of Care (Signed)
Immediate Anesthesia Transfer of Care Note  Patient: Victoria Reeves  Procedure(s) Performed: TOTAL KNEE ARTHROPLASTY (Left Knee)  Patient Location: PACU  Anesthesia Type:Spinal  Level of Consciousness: awake, alert  and oriented  Airway & Oxygen Therapy: Patient Spontanous Breathing and Patient connected to face mask oxygen  Post-op Assessment: Report given to RN and Post -op Vital signs reviewed and stable  Post vital signs: Reviewed and stable  Last Vitals:  Vitals Value Taken Time  BP 121/71 10/26/18 1324  Temp    Pulse 68 10/26/18 1325  Resp 15 10/26/18 1325  SpO2 100 % 10/26/18 1325  Vitals shown include unvalidated device data.  Last Pain:  Vitals:   10/26/18 0811  TempSrc:   PainSc: 0-No pain      Patients Stated Pain Goal: 4 ([id number])  Complications: No apparent anesthesia complications

## 2018-10-26 NOTE — Anesthesia Procedure Notes (Signed)
Procedure Name: MAC Date/Time: 10/26/2018 10:44 AM Performed by: Eben Burow, CRNA Pre-anesthesia Checklist: Patient identified, Emergency Drugs available, Suction available, Patient being monitored and Timeout performed Oxygen Delivery Method: Simple face mask Dental Injury: Teeth and Oropharynx as per pre-operative assessment

## 2018-10-26 NOTE — Brief Op Note (Signed)
10/26/2018  1:21 PM  PATIENT:  Victoria Reeves  73 y.o. female  PRE-OPERATIVE DIAGNOSIS:  OA LEFT KNEE  POST-OPERATIVE DIAGNOSIS:  OA LEFT KNEE   PROCEDURE:  Procedure(s): TOTAL KNEE ARTHROPLASTY (Left)  SURGEON:  Surgeon(s) and Role:    Earlie Server, MD - Primary  PHYSICIAN ASSISTANT: Chriss Czar, PA-C  ASSISTANTS: OR staff x1   ANESTHESIA:   local, regional, spinal and IV sedation  EBL:  25 mL   BLOOD ADMINISTERED:none  DRAINS: none   LOCAL MEDICATIONS USED:  MARCAINE     SPECIMEN:  No Specimen  DISPOSITION OF SPECIMEN:  N/A  COUNTS:  YES  TOURNIQUET:   Total Tourniquet Time Documented: Thigh (Left) - 69 minutes Total: Thigh (Left) - 69 minutes   DICTATION: .Other Dictation: Dictation Number unknown  PLAN OF CARE: Admit to inpatient   PATIENT DISPOSITION:  PACU - hemodynamically stable.   Delay start of Pharmacological VTE agent (>24hrs) due to surgical blood loss or risk of bleeding: yes

## 2018-10-26 NOTE — Interval H&P Note (Signed)
History and Physical Interval Note:  10/26/2018 9:31 AM  Victoria Reeves  has presented today for surgery, with the diagnosis of OA LEFT KNEE.  The various methods of treatment have been discussed with the patient and family. After consideration of risks, benefits and other options for treatment, the patient has consented to  Procedure(s): TOTAL KNEE ARTHROPLASTY (Left) as a surgical intervention.  The patient's history has been reviewed, patient examined, no change in status, stable for surgery.  I have reviewed the patient's chart and labs.  Questions were answered to the patient's satisfaction.     Yvette Rack

## 2018-10-26 NOTE — Anesthesia Preprocedure Evaluation (Addendum)
Anesthesia Evaluation  Patient identified by MRN, date of birth, ID band Patient awake    Reviewed: Allergy & Precautions, NPO status , Patient's Chart, lab work & pertinent test results  Airway Mallampati: II  TM Distance: >3 FB     Dental   Pulmonary    breath sounds clear to auscultation       Cardiovascular hypertension,  Rhythm:Regular Rate:Normal     Neuro/Psych    GI/Hepatic negative GI ROS, Neg liver ROS,   Endo/Other  diabetes  Renal/GU      Musculoskeletal   Abdominal   Peds  Hematology   Anesthesia Other Findings   Reproductive/Obstetrics                             Anesthesia Physical Anesthesia Plan  ASA: III  Anesthesia Plan: Spinal   Post-op Pain Management:  Regional for Post-op pain   Induction: Intravenous  PONV Risk Score and Plan: Ondansetron, Dexamethasone and Midazolam  Airway Management Planned: Nasal Cannula and Simple Face Mask  Additional Equipment:   Intra-op Plan:   Post-operative Plan:   Informed Consent: I have reviewed the patients History and Physical, chart, labs and discussed the procedure including the risks, benefits and alternatives for the proposed anesthesia with the patient or authorized representative who has indicated his/her understanding and acceptance.     Dental advisory given  Plan Discussed with: CRNA and Anesthesiologist  Anesthesia Plan Comments:        Anesthesia Quick Evaluation

## 2018-10-26 NOTE — Discharge Instructions (Signed)

## 2018-10-26 NOTE — Care Plan (Signed)
Ortho Bundle Case Management Note  Patient Details  Name: Victoria Reeves MRN: [id number] Date of Birth: Sep 25, 1945  Met with patient in the office at her H&P visit. Questions answered. She plans to discharge to home with family and HHPT. Rolling walker, 3n1 and CPM ordered for her from [place]. [place] referral to [facility]. Patient and MD in agreement with plan. Choice offered.                   DME Arranged:  CPM, Walker rolling DME Agency:  [facility]  HH Arranged:  PT [place] Agency:  [facility] (formerly [facility])  Additional Comments: Please contact me with any questions of if this plan should need to change.  Ladell Heads,  [place] [facility]ist  [phone] 10/26/2018, 8:03 AM

## 2018-10-26 NOTE — Progress Notes (Signed)
AssistedDr. Belinda Block with left, ultrasound guided, adductor canal block. Side rails up, monitors on throughout procedure. See vital signs in flow sheet. Tolerated Procedure well.

## 2018-10-26 NOTE — Evaluation (Signed)
Physical Therapy Evaluation Patient Details Name: Victoria Reeves MRN: [id number] DOB: 1945-10-28 Today's Date: 10/26/2018   History of Present Illness  Patient is 73 y.o. female s/p Lt TKA on 10/26/18 with PMH significant for HTN, DM, and OA.  Clinical Impression  ELDINE LIVA is a 73 y.o. female POD 0 s/p Lt TKA. Patient reports modified independence with rollator for mobility over last ~ 6 months. Patient is now limited by functional impairments (see PT problem list below) and requires min assist for transfers and gait with RW. Patient was able to ambulate ~70 feet with RW and cues for safe use, step pattern, and posture throughout. Patient instructed in exercise to facilitate ROM and circulation. Patient will benefit from continued skilled PT interventions to address impairments and progress towards PLOF. Acute PT will follow to progress mobility and stair training in preparation for safe discharge home.     Follow Up Recommendations Follow surgeon's recommendation for DC plan and follow-up therapies    Equipment Recommendations  None recommended by PT    Recommendations for Other Services       Precautions / Restrictions Precautions Precautions: Fall Restrictions Weight Bearing Restrictions: No      Mobility  Bed Mobility Overal bed mobility: Needs Assistance Bed Mobility: Supine to Sit     Supine to sit: HOB elevated;Min guard     General bed mobility comments: pt able to manage LE mobility without assist, cues for use of bed rails/technique provided  Transfers Overall transfer level: Needs assistance Equipment used: Rolling walker (2 wheeled) Transfers: Sit to/from Stand Sit to Stand: Min assist         General transfer comment: cues for safe hand placement and technique with RW, assist for power up and to steady throughout rising  Ambulation/Gait Ambulation/Gait assistance: Min assist Gait Distance (Feet): 70 Feet Assistive device: Rolling walker (2  wheeled) Gait Pattern/deviations: Step-through pattern;Decreased stride length;Decreased stance time - left;Decreased step length - right;Trunk flexed Gait velocity: decreased   General Gait Details: cues requried for safe hand placement at start and to maintain safe proximity to RW throughout gait, in assist initially to steady and pt improved gait throughout, no overt LOB noted  Stairs            Wheelchair Mobility    Modified Rankin (Stroke Patients Only)       Balance Overall balance assessment: Needs assistance Sitting-balance support: No upper extremity supported;Feet supported Sitting balance-Leahy Scale: Good     Standing balance support: During functional activity;Bilateral upper extremity supported Standing balance-Leahy Scale: Poor                  Pertinent Vitals/Pain Pain Assessment: 0-10 Pain Score: 2  Pain Location: Lt Knee Pain Descriptors / Indicators: (dense pressure) Pain Intervention(s): Monitored during session;Repositioned;Limited activity within patient's tolerance    Home Living Family/patient expects to be discharged to:: Private residence Living Arrangements: Spouse/significant other Available Help at Discharge: Family;Available 24 hours/day Type of Home: House Home Access: Stairs to enter Entrance Stairs-Rails: None Entrance Stairs-Number of Steps: (grab bars on front wall of house) Home Layout: One level Home Equipment: Walker - 2 wheels;Walker - 4 wheels;Cane - single point;Bedside commode;Shower seat;Grab bars - tub/shower Additional Comments: BSC over toilet    Prior Function Level of Independence: Independent with assistive device(s)         Comments: pt has been using rollator for ~6 months     Hand Dominance   Dominant Hand: Right  Extremity/Trunk Assessment   Upper Extremity Assessment Upper Extremity Assessment: Overall WFL for tasks assessed    Lower Extremity Assessment Lower Extremity Assessment:  Generalized weakness;LLE deficits/detail LLE Deficits / Details: pt with good quad acitvaiton supine, 4/5 for MMT to quad LLE Sensation: WNL LLE Coordination: WNL    Cervical / Trunk Assessment Cervical / Trunk Assessment: Normal  Communication   Communication: No difficulties  Cognition Arousal/Alertness: Awake/alert Behavior During Therapy: WFL for tasks assessed/performed Overall Cognitive Status: Within Functional Limits for tasks assessed                 General Comments      Exercises Total Joint Exercises Ankle Circles/Pumps: AROM;10 reps;Both;Seated Quad Sets: AROM;10 reps;Supine;Left Heel Slides: AROM;10 reps;Seated;Left   Assessment/Plan    PT Assessment Patient needs continued PT services  PT Problem List Decreased strength;Decreased balance;Decreased mobility;Decreased range of motion;Decreased activity tolerance;Decreased knowledge of use of DME       PT Treatment Interventions DME instruction;Functional mobility training;Balance training;Patient/family education;Modalities;Therapeutic activities;Gait training;Stair training;Therapeutic exercise    PT Goals (Current goals can be found in the Care Plan section)  Acute Rehab PT Goals Patient Stated Goal: to return home and get more independent again PT Goal Formulation: With patient Time For Goal Achievement: 11/02/18 Potential to Achieve Goals: Good    Frequency 7X/week    AM-PAC PT "6 Clicks" Mobility  Outcome Measure Help needed turning from your back to your side while in a flat bed without using bedrails?: A Little Help needed moving from lying on your back to sitting on the side of a flat bed without using bedrails?: A Little Help needed moving to and from a bed to a chair (including a wheelchair)?: A Little Help needed standing up from a chair using your arms (e.g., wheelchair or bedside chair)?: A Little Help needed to walk in hospital room?: A Little Help needed climbing 3-5 steps with a  railing? : A Lot 6 Click Score: 17    End of Session Equipment Utilized During Treatment: Gait belt Activity Tolerance: Patient tolerated treatment well Patient left: with call bell/phone within reach;with family/visitor present;in chair;with chair alarm set Nurse Communication: Mobility status PT Visit Diagnosis: Muscle weakness (generalized) (M62.81);Difficulty in walking, not elsewhere classified (R26.2)    Time: [id number] PT Time Calculation (min) (ACUTE ONLY): 36 min   Charges:   PT Evaluation $PT Eval Low Complexity: 1 Low PT Treatments $Therapeutic Exercise: 8-22 mins        Kipp Brood, PT, DPT Physical Therapist with [facility]  10/26/2018 6:06 PM

## 2018-10-27 ENCOUNTER — Encounter (HOSPITAL_COMMUNITY): Payer: Self-pay | Admitting: Orthopedic Surgery

## 2018-10-27 DIAGNOSIS — E785 Hyperlipidemia, unspecified: Secondary | ICD-10-CM | POA: Diagnosis not present

## 2018-10-27 DIAGNOSIS — I10 Essential (primary) hypertension: Secondary | ICD-10-CM | POA: Diagnosis not present

## 2018-10-27 DIAGNOSIS — E119 Type 2 diabetes mellitus without complications: Secondary | ICD-10-CM | POA: Diagnosis not present

## 2018-10-27 DIAGNOSIS — Z791 Long term (current) use of non-steroidal anti-inflammatories (NSAID): Secondary | ICD-10-CM | POA: Diagnosis not present

## 2018-10-27 DIAGNOSIS — M21162 Varus deformity, not elsewhere classified, left knee: Secondary | ICD-10-CM | POA: Diagnosis not present

## 2018-10-27 DIAGNOSIS — M170 Bilateral primary osteoarthritis of knee: Secondary | ICD-10-CM | POA: Diagnosis not present

## 2018-10-27 LAB — BASIC METABOLIC PANEL
Anion gap: 9 (ref 5–15)
BUN: 20 mg/dL (ref 8–23)
CO2: 25 mmol/L (ref 22–32)
Calcium: 8.9 mg/dL (ref 8.9–10.3)
Chloride: 103 mmol/L (ref 98–111)
Creatinine, Ser: 0.56 mg/dL (ref 0.44–1.00)
GFR calc Af Amer: >60 mL/min (ref >60)
GFR calc non Af Amer: >60 mL/min (ref >60)
Glucose, Bld: 136 mg/dL — ABNORMAL HIGH (ref 70–99)
Potassium: 3.8 mmol/L (ref 3.5–5.1)
Sodium: 137 mmol/L (ref 135–145)

## 2018-10-27 LAB — CBC
HCT: 38.1 % (ref 36.0–46.0)
Hemoglobin: 11.9 g/dL — ABNORMAL LOW (ref 12.0–15.0)
MCH: 30.4 pg (ref 26.0–34.0)
MCHC: 31.2 g/dL (ref 30.0–36.0)
MCV: 97.2 fL (ref 80.0–100.0)
Platelets: 165 10*3/uL (ref 150–400)
RBC: 3.92 MIL/uL (ref 3.87–5.11)
RDW: 14 % (ref 11.5–15.5)
WBC: 11.9 10*3/uL — ABNORMAL HIGH (ref 4.0–10.5)
nRBC: 0 % (ref 0.0–0.2)

## 2018-10-27 LAB — GLUCOSE, CAPILLARY
Glucose-Capillary: 112 mg/dL — ABNORMAL HIGH (ref 70–99)
Glucose-Capillary: 173 mg/dL — ABNORMAL HIGH (ref 70–99)

## 2018-10-27 NOTE — Evaluation (Signed)
Occupational Therapy Evaluation and Discharge Patient Details Name: Victoria Reeves MRN: [id number] DOB: 05/01/1945 Today's Date: 10/27/2018    History of Present Illness Patient is 73 y.o. female s/p Lt TKA on 10/26/18 with PMH significant for HTN, DM, and OA.   Clinical Impression   This 73 yo female admitted and underwent above presents to acute OT with all education completed, we will D/C from acute OT.    Follow Up Recommendations  No OT follow up;Supervision - Intermittent    Equipment Recommendations  None recommended by OT       Precautions / Restrictions Precautions Precautions: Fall Restrictions Weight Bearing Restrictions: No Other Position/Activity Restrictions: WBAT      Mobility Bed Mobility               General bed mobility comments: pt up in recliner upon arrival  Transfers Overall transfer level: Needs assistance Equipment used: Rolling walker (2 wheeled) Transfers: Sit to/from Stand Sit to Stand: Supervision            Balance Overall balance assessment: Mild deficits observed, not formally tested                                     ADL either performed or assessed with clinical judgement   ADL Overall ADL's : Needs assistance/impaired Eating/Feeding: Independent;Sitting   Grooming: Set up;Supervision/safety;Standing   Upper Body Bathing: Set up;Sitting   Lower Body Bathing: Supervison/ safety;Sit to/from stand   Upper Body Dressing : Set up;Sitting   Lower Body Dressing: Sit to/from stand;Supervision/safety   Toilet Transfer: Min guard;Ambulation;RW;BSC(over toilet) Toilet Transfer Details (indicate cue type and reason): per pt report Toileting- Clothing Manipulation and Hygiene: Supervision/safety;Sit to/from stand Toileting - Clothing Manipulation Details (indicate cue type and reason): per pt report Tub/ Shower Transfer: Walk-in shower;Min guard;Ambulation;Rolling walker Tub/Shower Transfer Details  (indicate cue type and reason): backing into shower stall Functional mobility during ADLs: Min guard;Rolling walker                    Pertinent Vitals/Pain Pain Assessment: 0-10 Pain Score: 5  Pain Location: L knee Pain Descriptors / Indicators: Sore;Discomfort Pain Intervention(s): Monitored during session;Limited activity within patient's tolerance     Hand Dominance Right   Extremity/Trunk Assessment Upper Extremity Assessment Upper Extremity Assessment: Overall WFL for tasks assessed           Communication Communication Communication: No difficulties   Cognition Arousal/Alertness: Awake/alert Behavior During Therapy: WFL for tasks assessed/performed Overall Cognitive Status: Within Functional Limits for tasks assessed                                                Home Living Family/patient expects to be discharged to:: Private residence Living Arrangements: Spouse/significant other Available Help at Discharge: Family;Available 24 hours/day Type of Home: House Home Access: Stairs to enter [employer] of Steps: (grab bars on front wall of house) Entrance Stairs-Rails: None Home Layout: One level     Bathroom Shower/Tub: Walk-in shower;Door   Bathroom Toilet: Handicapped height     Home Equipment: Environmental consultant - 2 wheels;Walker - 4 wheels;Cane - single point;Bedside commode;Shower seat;Grab bars - tub/shower;Hand held shower head          Prior Functioning/Environment Level of Independence: Independent with assistive  device(s)        Comments: pt has been using rollator for ~6 months        OT Problem List: Decreased range of motion;Pain;Impaired balance (sitting and/or standing)         OT Goals(Current goals can be found in the care plan section) Acute Rehab OT Goals Patient Stated Goal: to go home                AM-PAC OT "6 Clicks" Daily Activity     Outcome Measure Help from another person eating meals?:  None Help from another person taking care of personal grooming?: A Little Help from another person toileting, which includes using toliet, bedpan, or urinal?: A Little Help from another person bathing (including washing, rinsing, drying)?: A Little Help from another person to put on and taking off regular upper body clothing?: A Little Help from another person to put on and taking off regular lower body clothing?: A Little 6 Click Score: 19   End of Session Equipment Utilized During Treatment: Rolling walker  Activity Tolerance: Patient tolerated treatment well Patient left: in chair;with family/visitor present(getting ready to work with PT)  OT Visit Diagnosis: Unsteadiness on feet (R26.81);Pain Pain - Right/Left: Left Pain - part of body: Knee(back of thigh)                Time: 1010-1028 OT Time Calculation (min): 18 min Charges:  OT General Charges $OT Visit: 1 Visit OT Evaluation $OT Eval Moderate Complexity: [place], OTR/L Acute [employer] Pager [phone] Office [phone]    Almon Register 10/27/2018, 11:52 AM

## 2018-10-27 NOTE — Progress Notes (Signed)
Physical Therapy Treatment Patient Details Name: Victoria Reeves MRN: [id number] DOB: 01-22-1945 Today's Date: 10/27/2018    History of Present Illness Patient is 73 y.o. female s/p Lt TKA on 10/26/18 with PMH significant for HTN, DM, and OA.    PT Comments    Progressing well with mobility. Will plan to have a 2nd session later day. Pt could potentially d/c home later today if okay with MD.    Follow Up Recommendations  Follow surgeon's recommendation for DC plan and follow-up therapies     Equipment Recommendations  None recommended by PT    Recommendations for Other Services       Precautions / Restrictions Precautions Precautions: Fall Restrictions Weight Bearing Restrictions: No Other Position/Activity Restrictions: WBAT    Mobility  Bed Mobility               General bed mobility comments: oob in recliner  Transfers Overall transfer level: Needs assistance Equipment used: Rolling walker (2 wheeled) Transfers: Sit to/from Stand Sit to Stand: Min guard         General transfer comment: close guard for safety. VCs safety, hand placement  Ambulation/Gait Ambulation/Gait assistance: Min guard Gait Distance (Feet): 125 Feet Assistive device: Rolling walker (2 wheeled) Gait Pattern/deviations: Step-through pattern;Decreased stride length     General Gait Details: close guard for safety.   Stairs             Wheelchair Mobility    Modified Rankin (Stroke Patients Only)       Balance Overall balance assessment: Mild deficits observed, not formally tested         Standing balance support: Bilateral upper extremity supported Standing balance-Leahy Scale: Poor                              Cognition Arousal/Alertness: Awake/alert Behavior During Therapy: WFL for tasks assessed/performed Overall Cognitive Status: Within Functional Limits for tasks assessed                                         Exercises Total Joint Exercises Ankle Circles/Pumps: AROM;10 reps;Both;Seated Quad Sets: AROM;10 reps;Left;Seated Heel Slides: AROM;10 reps;Seated;Left;AAROM Hip ABduction/ADduction: AROM;Left;10 reps;Supine Straight Leg Raises: AROM;Left;10 reps;Supine Goniometric ROM: ~5-75 degrees    General Comments        Pertinent Vitals/Pain Pain Assessment: 0-10 Pain Score: 5  Pain Location: L knee Pain Descriptors / Indicators: Sore;Discomfort Pain Intervention(s): Monitored during session;Ice applied    Home Living                      Prior Function            PT Goals (current goals can now be found in the care plan section) Progress towards PT goals: Progressing toward goals    Frequency    7X/week      PT Plan Current plan remains appropriate    Co-evaluation              AM-PAC PT "6 Clicks" Mobility   Outcome Measure  Help needed turning from your back to your side while in a flat bed without using bedrails?: A Little Help needed moving from lying on your back to sitting on the side of a flat bed without using bedrails?: A Little Help needed moving to and from a bed to a  chair (including a wheelchair)?: A Little Help needed standing up from a chair using your arms (e.g., wheelchair or bedside chair)?: A Little Help needed to walk in hospital room?: A Little Help needed climbing 3-5 steps with a railing? : A Little 6 Click Score: 18    End of Session Equipment Utilized During Treatment: Gait belt Activity Tolerance: Patient tolerated treatment well Patient left: in chair;with call bell/phone within reach;with family/visitor present   PT Visit Diagnosis: Other abnormalities of gait and mobility (R26.89)     Time: [id number] PT Time Calculation (min) (ACUTE ONLY): 18 min  Charges:  $Gait Training: 8-22 mins                        Weston Anna, [place] Pager: [phone] Office: [phone]

## 2018-10-27 NOTE — Op Note (Signed)
NAMEELBERTA, Victoria Reeves MEDICAL RECORD [id number] ACCOUNT [id number] DATE OF BIRTH:[birth year] FACILITY: [facility] LOCATION: [facility] PHYSICIAN:W. Lynnette Pote JR., MD  OPERATIVE REPORT  DATE OF PROCEDURE:  10/26/2018  PREOPERATIVE DIAGNOSIS:  Severe osteoarthritis with severe varus deformity.  POSTOPERATIVE DIAGNOSIS:  Severe osteoarthritis with severe varus deformity.  OPERATION:  Left total knee (Attune cemented knee size 5 femur, tibia, 10 mm bearing with 35 mm all poly patella.  SURGEON:  Vangie Bicker, MD  ASSISTANTMarjo Bicker  ANESTHESIA:  Spinal anesthetic with block.   TOURNIQUET TIME:  67 minutes.  DESCRIPTION OF PROCEDURE:  Sterile prep and drape, exsanguination of the leg to 375 due to the large circumference.  Straight skin incision was made with a medial parapatellar approach to the knee made.  We then fired the distal femur relative to making  a 5-degree valgus cut at 9 mm, cutting thereafter about a 5-6 mm below the least diseased lateral compartment.  Extension gap eventually measured due to bone loss as well as ligamentous and quality at 10 mm.  The femur was sized to be a size 5 followed  by placement of all-in-1 cutting block with appropriate degree of external rotation, accomplishing the anterior, posterior chamfer cuts.  PCL was released.  Small osteophytes removed from the posterior aspect of the knee.  Subcutaneous tissues and  capsule were infiltrated with a mixture of Exparel and Marcaine.  The keel was cut for the tibia followed by box cut on the femur.  Trials were placed.  The patella was cut, leaving about 15-16 mm of native patella.  All trials were placed.  The patient  had a mild flexion contracture.  Severe varus was corrected.  We did trial multiple thicknesses and settled on the 10 mm bearing.  We cemented the components using antibiotic cement due to the patient's history of diabetes, 1 g of vancomycin per batch of  cement.  The cement was allowed  to harden with the final prosthesis with the trial bearing.  Trial bearing was removed.  No excessive cement was noted.  We then released the tourniquet under direct vision.  No excessive bleeding was noted.  Small  bleeders were coagulated.  Final bearing was placed, and again range of motion was noted to be 0-120.  No instability in any range of motion, good balancing of the ligaments.  Closure was effected on the capsule with #1 Ethibond, on the subcutaneous  tissues 0 and 2-0 Vicryl, skin with Monocryl.  A lightly compressive sterile dressing and knee immobilizer were applied.  Taken to recovery room in stable condition.  LN/NUANCE  D:10/26/2018 T:10/27/2018 JOB:[id number]

## 2018-10-27 NOTE — Progress Notes (Signed)
Pt stable with no needs at time of d/c. Pt has all needed home equipment. No changes to note. Pt ace wrap wnl. Pt to have home health and follow up with md.

## 2018-10-27 NOTE — Progress Notes (Signed)
    Home health agencies that serve [phone].        [place] Quality of Patient Care Rating Patient Survey Summary Rating  [facility] [phone] 4 out of 5 stars 4 out of [place] [phone] 3 out of 5 stars 4 out of [place] [phone] 4  out of 5 stars 3 out of [place] [phone] 4 out of 5 stars 4 out of [place] [phone] 4  out of 5 stars 4 out of [place] [phone] 4 out of 5 stars 4 out of [place] [phone] 4 out of 5 stars 4 out of 5 stars  [facility] [place] [phone] 3  out of 5 stars 4 out of [place] [phone] 3 out of 5 stars Not [place] [phone] 3 out of 5 stars 4 out of 5 stars  [facility] [phone] 4 out of 5 stars Not Available12  [facility] [phone] 3  out of 5 stars 3 out of [place] [phone] 3  out of 5 stars 4 out of [place] [phone] 3  out of 5 stars 3 out of [place] [phone] 3  out of 5 stars Not [place] [phone] 4  out of 5 stars 3 out of [place] [phone] 4  out of 5 stars 2 out of [place] number Footnote as displayed on [place]  1 This agency provides services under a federal waiver program to non-traditional, chronic long term population.  2 This agency provides services to a special needs population.  3 Not Available.  4 The number of patient episodes for this measure is too small to report.  5 This measure currently does not have data or  provider has been certified/recertified for less than 6 months.  6 The national average for this measure is not provided because of state-to-state differences in data collection.  7 Medicare is not displaying rates for this measure for any home health agency, because of an issue with the data.  8 There were problems with the data and they are being corrected.  9 Zero, or very few, patients met the survey's rules for inclusion. The scores shown, if any, reflect a very small number of surveys and may not accurately tell how an agency is doing.  10 Survey results are based on less than 12 months of data.  11 Fewer than 70 patients completed the survey. Use the scores shown, if any, with caution as the number of surveys may be too low to accurately tell how an agency is doing.  12 No survey results are available for this period.  13 Data suppressed by CMS for one or more quarters.

## 2018-10-27 NOTE — Progress Notes (Signed)
Orthopaedic Trauma Service Progress Note  Patient ID: Victoria Reeves MRN: [id number] DOB/AGE: Dec 18, 1945 73 y.o.  Subjective:  Doing very well Ready to go home Pain well controlled  No acute issues    Review of Systems  Constitutional: Negative for chills and fever.  Respiratory: Negative for shortness of breath.   Cardiovascular: Negative for chest pain and palpitations.  Gastrointestinal: Negative for abdominal pain, nausea and vomiting.  Neurological: Negative for tingling and sensory change.    Objective:   VITALS:   Vitals:   10/27/18 0127 10/27/18 0448 10/27/18 0955 10/27/18 1350  BP: 134/64 (!) 129/59 (!) 137/51 (!) 115/54  Pulse: 70 69 86 75  Resp: 17 18 14 15   Temp: (!) 97.5 F (36.4 C) (!) 97.5 F (36.4 C) 98 F (36.7 C) 98.3 F (36.8 C)  TempSrc: Oral Oral    SpO2: 97% 99% 98% 97%  Weight:      Height:        Estimated body mass index is 35.32 kg/m as calculated from the following:   Height as of this encounter: 5\' 3"  (1.6 m).   Weight as of this encounter: 90.4 kg.   Intake/Output      10/23 0701 - 10/24 0700 10/24 0701 - 10/25 0700   P.O. 600 480   I.V. (mL/kg) 2533.8 (28) 118 (1.3)   IV Piggyback 330.7    Total Intake(mL/kg) 3464.5 (38.3) 598 (6.6)   Urine (mL/kg/hr) 5176    Blood 25    Total Output 5201    Net -1736.5 +598        Urine Occurrence 1 x 1 x     LABS  Results for orders placed or performed during the hospital encounter of 10/26/18 (from the past 24 hour(s))  Glucose, capillary     Status: Abnormal   Collection Time: 10/26/18  4:37 PM  Result Value Ref Range   Glucose-Capillary 127 (H) 70 - 99 mg/dL   Comment 1 Notify RN    Comment 2 Document in Chart   Glucose, capillary     Status: Abnormal   Collection Time: 10/26/18 10:13 PM  Result Value Ref Range   Glucose-Capillary 176 (H) 70 - 99 mg/dL   Comment 1 Notify RN   CBC     Status:  Abnormal   Collection Time: 10/27/18  3:05 AM  Result Value Ref Range   WBC 11.9 (H) 4.0 - 10.5 K/uL   RBC 3.92 3.87 - 5.11 MIL/uL   Hemoglobin 11.9 (L) 12.0 - 15.0 g/dL   HCT 38.1 36.0 - 46.0 %   MCV 97.2 80.0 - 100.0 fL   MCH 30.4 26.0 - 34.0 pg   MCHC 31.2 30.0 - 36.0 g/dL   RDW 14.0 11.5 - 15.5 %   Platelets 165 150 - 400 K/uL   nRBC 0.0 0.0 - 0.2 %  Basic metabolic panel     Status: Abnormal   Collection Time: 10/27/18  3:05 AM  Result Value Ref Range   Sodium 137 135 - 145 mmol/L   Potassium 3.8 3.5 - 5.1 mmol/L   Chloride 103 98 - 111 mmol/L   CO2 25 22 - 32 mmol/L   Glucose, Bld 136 (H) 70 - 99 mg/dL   BUN 20 8 - 23 mg/dL   Creatinine, Ser 0.56 0.44 -  1.00 mg/dL   Calcium 8.9 8.9 - 10.3 mg/dL   GFR calc non Af Amer >60 >60 mL/min   GFR calc Af Amer >60 >60 mL/min   Anion gap 9 5 - 15  Glucose, capillary     Status: Abnormal   Collection Time: 10/27/18  8:01 AM  Result Value Ref Range   Glucose-Capillary 112 (H) 70 - 99 mg/dL  Glucose, capillary     Status: Abnormal   Collection Time: 10/27/18 11:51 AM  Result Value Ref Range   Glucose-Capillary 173 (H) 70 - 99 mg/dL     PHYSICAL EXAM:   Gen: awake, alert, sitting up in chair, NAD, appears well. Very pleasant  Lungs: unlabored  Cardiac: regular  Ext:       Left Lower Extremity   Dressing c/d/i  Ext warm   No DCT   Swelling controlled  DPN, SPN, TN sensation intact  EHL, FHL, AT, PT, peroneals, gastroc motor intact  + Quad set   + DP pulse  Compartments are soft   No pain with passive stretching   Assessment/Plan: 1 Day Post-Op   Principal Problem:   Primary localized osteoarthritis of left knee Active Problems:   Hypertension   Diabetes mellitus without complication ([place])   Anti-infectives (From admission, onward)   Start     Dose/Rate Route Frequency Ordered Stop   10/26/18 1700  ceFAZolin (ANCEF) IVPB 2g/100 mL premix     2 g 200 mL/hr over 30 Minutes Intravenous Every 6 hours 10/26/18  1433 10/26/18 2256   10/26/18 0800  ceFAZolin (ANCEF) IVPB 2g/100 mL premix     2 g 200 mL/hr over 30 Minutes Intravenous On call to O.R. 10/26/18 [id number] 10/26/18 1055    .  POD/HD#: 1  73 y/o female s/p L TKA for end stage DJD L knee   -End stage DJD L Knee s/p L TKA   WBAT  ROM as tolerated  Continue with CPM  Ice and elevate  Dressing changes as needed, see dc instructions  Follow up with Dr. French Ana and Merrily Pew in 10-14 days   - Pain management:  Continue with current regimen   - ABL anemia/Hemodynamics  Stable  - Medical issues   DM   Resume home meds at dc   - DVT/PE prophylaxis:  ASA 81 mg BID per surgical team   - ID:   periop abx completed   - Activity:  As above  - FEN/GI prophylaxis/Foley/Lines:  CHO mod diet  Dc IV   - Dispo:  Dc home today   Follow up with Ortho in 10-14 days    Jari Pigg, PA-C [phone] (C) 10/27/2018, 2:09 PM  [facility] [place] [address] [phone] Domingo Sep (F)

## 2018-10-27 NOTE — Progress Notes (Signed)
Physical Therapy Treatment Patient Details Name: Victoria Reeves MRN: [id number] DOB: Jul 23, 1945 Today's Date: 10/27/2018    History of Present Illness Patient is 73 y.o. female s/p Lt TKA on 10/26/18 with PMH significant for HTN, DM, and OA.    PT Comments    Progressing well with mobility. Reviewed/practiced gait training and stair training. All education completed. Okay to d/c from PT standpoint.    Follow Up Recommendations  Follow surgeon's recommendation for DC plan and follow-up therapies     Equipment Recommendations  None recommended by PT    Recommendations for Other Services       Precautions / Restrictions Precautions Precautions: Fall Restrictions Weight Bearing Restrictions: No Other Position/Activity Restrictions: WBAT    Mobility  Bed Mobility               General bed mobility comments: pt up in recliner upon arrival  Transfers Overall transfer level: Needs assistance Equipment used: Rolling walker (2 wheeled) Transfers: Sit to/from Stand Sit to Stand: Supervision         General transfer comment: for safety. VCs safety, hand placement  Ambulation/Gait Ambulation/Gait assistance: Min guard Gait Distance (Feet): 125 Feet Assistive device: Rolling walker (2 wheeled) Gait Pattern/deviations: Step-to pattern;Step-through pattern;Decreased stride length     General Gait Details: close guard for safety.   Stairs Stairs: Yes Min guard    Number of Stairs: 3 General stair comments: Up and over portable stairs x 1. VCs safety, technique, sequence. Husband present to observe.   Wheelchair Mobility    Modified Rankin (Stroke Patients Only)       Balance Overall balance assessment: Mild deficits observed, not formally tested         Standing balance support: Bilateral upper extremity supported Standing balance-Leahy Scale: Poor                              Cognition Arousal/Alertness: Awake/alert Behavior  During Therapy: WFL for tasks assessed/performed Overall Cognitive Status: Within Functional Limits for tasks assessed                                        Exercises Total Joint Exercises Ankle Circles/Pumps: AROM;10 reps;Both;Seated Quad Sets: AROM;10 reps;Left;Seated Heel Slides: AROM;10 reps;Seated;Left;AAROM Hip ABduction/ADduction: AROM;Left;10 reps;Supine Straight Leg Raises: AROM;Left;10 reps;Supine Goniometric ROM: ~5-75 degrees    General Comments        Pertinent Vitals/Pain Pain Assessment: 0-10 Pain Score: 5  Pain Location: L knee Pain Descriptors / Indicators: Sore;Discomfort Pain Intervention(s): Monitored during session;Repositioned    Home Living Family/patient expects to be discharged to:: Private residence Living Arrangements: Spouse/significant other Available Help at Discharge: Family;Available 24 hours/day Type of Home: House Home Access: Stairs to enter Entrance Stairs-Rails: None Home Layout: One level Home Equipment: Environmental consultant - 2 wheels;Walker - 4 wheels;Cane - single point;Bedside commode;Shower seat;Grab bars - tub/shower;Hand held shower head      Prior Function Level of Independence: Independent with assistive device(s)      Comments: pt has been using rollator for ~6 months   PT Goals (current goals can now be found in the care plan section) Acute Rehab PT Goals Patient Stated Goal: to go home Progress towards PT goals: Progressing toward goals    Frequency    7X/week      PT Plan Current plan remains appropriate  Co-evaluation              AM-PAC PT "6 Clicks" Mobility   Outcome Measure  Help needed turning from your back to your side while in a flat bed without using bedrails?: A Little Help needed moving from lying on your back to sitting on the side of a flat bed without using bedrails?: A Little Help needed moving to and from a bed to a chair (including a wheelchair)?: A Little Help needed  standing up from a chair using your arms (e.g., wheelchair or bedside chair)?: A Little Help needed to walk in hospital room?: A Little Help needed climbing 3-5 steps with a railing? : A Little 6 Click Score: 18    End of Session Equipment Utilized During Treatment: Gait belt Activity Tolerance: Patient tolerated treatment well Patient left: in chair;with call bell/phone within reach;with family/visitor present   PT Visit Diagnosis: Other abnormalities of gait and mobility (R26.89)     Time: [id number] PT Time Calculation (min) (ACUTE ONLY): 12 min  Charges:  $Gait Training: 8-22 mins              Weston Anna, PT Acute Rehabilitation Services Pager: [phone] Office: [phone]

## 2018-10-27 NOTE — Care Management CC44 (Signed)
Condition Code 44 Documentation Completed  Patient Details  Name: KIVA CHUGG MRN: [id number] Date of Birth: Dec 20, 1945   Condition Code 44 given:  Yes Patient signature on Condition Code 44 notice:  Yes Documentation of 2 MD's agreement:  Yes Code 44 added to claim:  Yes    Joaquin Courts, RN 10/27/2018, 4:04 PM

## 2018-10-27 NOTE — Care Management Obs Status (Signed)
[facility] NOTIFICATION   Patient Details  Name: Victoria Reeves MRN: [id number] Date of Birth: 1945-01-22   Medicare Observation Status Notification Given:  Yes    Joaquin Courts, RN 10/27/2018, 4:03 PM

## 2018-10-27 NOTE — TOC Progression Note (Signed)
Transition of Care [facility]) - Progression Note    Patient Details  Name: Victoria Reeves MRN: [id number] Date of Birth: March 21, 1945  Transition of Care [facility] CM/SW Contact  Joaquin Courts, RN Phone Number: 10/27/2018, 12:25 PM  Clinical Narrative:    CM spoke with patient at bedside. [facility] with [facility] [place]. Reports has rolling walker and 3-in-1 at home.    Expected Discharge Plan: [place] Barriers to Discharge: Continued Medical Work up  Expected Discharge Plan and Services Expected Discharge Plan: [place]   Discharge Planning Services: CM Consult Post Acute Care Choice: [place] arrangements for the past 2 months: Single Family Home                 DME Arranged: N/A DME Agency: NA       HH Arranged: PT HH Agency: [facility] [employer]         Social Determinants of Health ([place]) Interventions    Readmission Risk Interventions No flowsheet data found.

## 2018-10-28 DIAGNOSIS — E119 Type 2 diabetes mellitus without complications: Secondary | ICD-10-CM | POA: Diagnosis not present

## 2018-10-28 DIAGNOSIS — Z794 Long term (current) use of insulin: Secondary | ICD-10-CM | POA: Diagnosis not present

## 2018-10-28 DIAGNOSIS — E785 Hyperlipidemia, unspecified: Secondary | ICD-10-CM | POA: Diagnosis not present

## 2018-10-28 DIAGNOSIS — Z9181 History of falling: Secondary | ICD-10-CM | POA: Diagnosis not present

## 2018-10-28 DIAGNOSIS — Z6834 Body mass index (BMI) 34.0-34.9, adult: Secondary | ICD-10-CM | POA: Diagnosis not present

## 2018-10-28 DIAGNOSIS — E669 Obesity, unspecified: Secondary | ICD-10-CM | POA: Diagnosis not present

## 2018-10-28 DIAGNOSIS — Z96652 Presence of left artificial knee joint: Secondary | ICD-10-CM | POA: Diagnosis not present

## 2018-10-28 DIAGNOSIS — I10 Essential (primary) hypertension: Secondary | ICD-10-CM | POA: Diagnosis not present

## 2018-10-28 DIAGNOSIS — Z471 Aftercare following joint replacement surgery: Secondary | ICD-10-CM | POA: Diagnosis not present

## 2018-10-28 NOTE — Anesthesia Postprocedure Evaluation (Signed)
Anesthesia Post Note  Patient: Victoria Reeves  Procedure(s) Performed: TOTAL KNEE ARTHROPLASTY (Left Knee)     Anesthesia Post Evaluation  Last Vitals:  Vitals:   10/27/18 0955 10/27/18 1350  BP: (!) 137/51 (!) 115/54  Pulse: 86 75  Resp: 14 15  Temp: 36.7 C 36.8 C  SpO2: 98% 97%    Last Pain:  Vitals:   10/27/18 1634  TempSrc:   PainSc: 2                  Emmalyn Hinson

## 2018-10-28 NOTE — Anesthesia Procedure Notes (Signed)
Anesthesia Regional Block: Adductor canal block   Pre-Anesthetic Checklist: ,, timeout performed, Correct Patient, Correct Site, Correct Laterality, Correct Procedure, Correct Position, site marked, Risks and benefits discussed,  Surgical consent,  Pre-op evaluation,  At surgeon's request and post-op pain management  Laterality: Left  Prep: chloraprep       Needles:   Needle Type: Echogenic Needle          Additional Needles:   Procedures: Doppler guided,,,, ultrasound used (permanent image in chart),,,,  Narrative:  Start time: 10/24/2018 10:00 AM End time: 10/24/2018 10:15 AM  Performed by: Personally  Anesthesiologist: Belinda Block, MD

## 2018-10-29 ENCOUNTER — Encounter (HOSPITAL_COMMUNITY): Payer: Self-pay | Admitting: Orthopedic Surgery

## 2018-10-30 NOTE — Anesthesia Postprocedure Evaluation (Signed)
Anesthesia Post Note  Patient: Victoria Reeves  Procedure(s) Performed: TOTAL KNEE ARTHROPLASTY (Left Knee)     Patient location during evaluation: PACU Anesthesia Type: Spinal Level of consciousness: awake Pain management: pain level controlled Vital Signs Assessment: post-procedure vital signs reviewed and stable Respiratory status: spontaneous breathing Cardiovascular status: stable Postop Assessment: no apparent nausea or vomiting Anesthetic complications: no    Last Vitals:  Vitals:   10/27/18 0955 10/27/18 1350  BP: (!) 137/51 (!) 115/54  Pulse: 86 75  Resp: 14 15  Temp: 36.7 C 36.8 C  SpO2: 98% 97%    Last Pain:  Vitals:   10/27/18 1634  TempSrc:   PainSc: 2                  Kaipo Ardis

## 2018-10-30 NOTE — Addendum Note (Signed)
Addendum  created 10/30/18 1155 by Belinda Block, MD   Child order released for a procedure order, Clinical Note Signed, Intraprocedure Blocks edited

## 2018-10-30 NOTE — Anesthesia Procedure Notes (Signed)
Spinal  Start time: 10/30/2018 10:35 AM End time: 10/30/2018 10:40 AM Staffing Anesthesiologist: Belinda Block, MD Preanesthetic Checklist Completed: patient identified, site marked, surgical consent, pre-op evaluation, timeout performed, IV checked and risks and benefits discussed Spinal Block Patient position: sitting Prep: DuraPrep Patient monitoring: heart rate, cardiac monitor, continuous pulse ox and blood pressure Approach: midline Location: L3-4 Injection technique: single-shot Needle Needle type: [place]  Needle gauge: 24 G Assessment Sensory level: T12 Additional Notes Clear CSF spinal marcaine 12mg  marcaine. Exp checked. Pt tolerated procedure well Dr. Nyoka Cowden performed procedure

## 2018-10-31 DIAGNOSIS — E119 Type 2 diabetes mellitus without complications: Secondary | ICD-10-CM | POA: Diagnosis not present

## 2018-10-31 DIAGNOSIS — Z471 Aftercare following joint replacement surgery: Secondary | ICD-10-CM | POA: Diagnosis not present

## 2018-10-31 DIAGNOSIS — Z6834 Body mass index (BMI) 34.0-34.9, adult: Secondary | ICD-10-CM | POA: Diagnosis not present

## 2018-10-31 DIAGNOSIS — E785 Hyperlipidemia, unspecified: Secondary | ICD-10-CM | POA: Diagnosis not present

## 2018-10-31 DIAGNOSIS — I10 Essential (primary) hypertension: Secondary | ICD-10-CM | POA: Diagnosis not present

## 2018-10-31 DIAGNOSIS — E669 Obesity, unspecified: Secondary | ICD-10-CM | POA: Diagnosis not present

## 2018-11-05 DIAGNOSIS — Z471 Aftercare following joint replacement surgery: Secondary | ICD-10-CM | POA: Diagnosis not present

## 2018-11-05 DIAGNOSIS — E119 Type 2 diabetes mellitus without complications: Secondary | ICD-10-CM | POA: Diagnosis not present

## 2018-11-05 DIAGNOSIS — E669 Obesity, unspecified: Secondary | ICD-10-CM | POA: Diagnosis not present

## 2018-11-05 DIAGNOSIS — Z6834 Body mass index (BMI) 34.0-34.9, adult: Secondary | ICD-10-CM | POA: Diagnosis not present

## 2018-11-05 DIAGNOSIS — I10 Essential (primary) hypertension: Secondary | ICD-10-CM | POA: Diagnosis not present

## 2018-11-05 DIAGNOSIS — E785 Hyperlipidemia, unspecified: Secondary | ICD-10-CM | POA: Diagnosis not present

## 2018-11-07 DIAGNOSIS — Z6834 Body mass index (BMI) 34.0-34.9, adult: Secondary | ICD-10-CM | POA: Diagnosis not present

## 2018-11-07 DIAGNOSIS — I10 Essential (primary) hypertension: Secondary | ICD-10-CM | POA: Diagnosis not present

## 2018-11-07 DIAGNOSIS — Z471 Aftercare following joint replacement surgery: Secondary | ICD-10-CM | POA: Diagnosis not present

## 2018-11-07 DIAGNOSIS — E785 Hyperlipidemia, unspecified: Secondary | ICD-10-CM | POA: Diagnosis not present

## 2018-11-07 DIAGNOSIS — E119 Type 2 diabetes mellitus without complications: Secondary | ICD-10-CM | POA: Diagnosis not present

## 2018-11-07 DIAGNOSIS — E669 Obesity, unspecified: Secondary | ICD-10-CM | POA: Diagnosis not present

## 2018-11-09 NOTE — Discharge Summary (Signed)
PATIENT ID: Victoria Reeves        MRN:  [id number]          DOB/AGE: [birth year] / 73 y.o.    DISCHARGE SUMMARY  ADMISSION DATE:    10/26/2018 DISCHARGE DATE:   10/27/2018  ADMISSION DIAGNOSIS: OA LEFT KNEE    DISCHARGE DIAGNOSIS:  OA LEFT KNEE    ADDITIONAL DIAGNOSIS: Principal Problem:   Primary localized osteoarthritis of left knee Active Problems:   Hypertension   Diabetes mellitus without complication ([place])  Past Medical History:  Diagnosis Date  . Arthritis   . Diabetes mellitus without complication ([place])    type 2  . Hypertension     PROCEDURE: Procedure(s): TOTAL KNEE ARTHROPLASTY Left on 10/26/2018  CONSULTS: PT    HISTORY:  See H&P in chart  HOSPITAL COURSE:  Victoria Reeves is a 73 y.o. admitted on 10/26/2018 and found to have a diagnosis of OA LEFT KNEE.  After appropriate laboratory studies were obtained  they were taken to the operating room on 10/26/2018 and underwent  Procedure(s): TOTAL KNEE ARTHROPLASTY  Left.   They were given perioperative antibiotics:  Anti-infectives (From admission, onward)   Start     Dose/Rate Route Frequency Ordered Stop   10/26/18 1700  ceFAZolin (ANCEF) IVPB 2g/100 mL premix     2 g 200 mL/hr over 30 Minutes Intravenous Every 6 hours 10/26/18 1433 10/26/18 2256   10/26/18 0800  ceFAZolin (ANCEF) IVPB 2g/100 mL premix     2 g 200 mL/hr over 30 Minutes Intravenous On call to O.R. 10/26/18 0754 10/26/18 1055    .  Tolerated the procedure well.  Placed with a foley intraoperatively.     POD #1, allowed out of bed to a chair.  PT for ambulation and exercise program.  Foley D/C'd in morning.  IV saline locked.  O2 discontionued.  The remainder of the hospital course was dedicated to ambulation and strengthening.   The patient was discharged on 1 day post op in  Stable condition.  Blood products given:none  DIAGNOSTIC STUDIES: Recent vital signs: No data found.     Recent laboratory studies: No results for  input(s): WBC, HGB, HCT, PLT in the last 168 hours. No results for input(s): NA, K, CL, CO2, BUN, CREATININE, GLUCOSE, CALCIUM in the last 168 hours. Lab Results  Component Value Date   INR 1.0 10/18/2018     Recent Radiographic Studies :  No results found.  DISCHARGE INSTRUCTIONS: Discharge Instructions    Discharge patient   Complete by: As directed    Discharge disposition: 01-Home or Self Care   Discharge patient date: 10/27/2018      DISCHARGE MEDICATIONS:   Allergies as of 10/27/2018   No Known Allergies     Medication List    STOP taking these medications   meloxicam 15 MG tablet Commonly known as: MOBIC     TAKE these medications   acetaminophen 325 MG tablet Commonly known as: Tylenol Take 2 tablets (650 mg total) by mouth every 4 (four) hours as needed.   Apple Cider Vinegar 500 MG Tabs Take 2 each by mouth daily.   aspirin EC 81 MG tablet Take 1 tablet (81 mg total) by mouth 2 (two) times daily. TO PREVENT BLOOD CLOTS What changed:   when to take this  additional instructions   Biotin 10000 MCG Tabs Take 10,000 mcg by mouth daily.   CALCIUM PO Take 1,000 mg by mouth daily.   celecoxib 200  MG capsule Commonly known as: CeleBREX Take 1 capsule (200 mg total) by mouth daily.   Cinnamon 500 MG capsule Take 500 mg by mouth daily.   Cranberry 500 MG Chew Chew 1,000 mg by mouth daily.   docusate sodium 100 MG capsule Commonly known as: Colace Take 1 capsule (100 mg total) by mouth daily as needed.   ELDERBERRY PO Take 2 each by mouth daily.   FIBER ADULT GUMMIES PO Take 2 each by mouth daily.   gabapentin 300 MG capsule Commonly known as: Neurontin Take 1 capsule (300 mg total) by mouth 3 (three) times daily for 14 days, THEN 1 capsule (300 mg total) 2 (two) times daily for 3 days, THEN 1 capsule (300 mg total) daily for 4 days. Start taking on: October 26, 2018   insulin glargine 100 UNIT/ML injection Commonly known as: LANTUS Inject  5 Units into the skin at bedtime.   lovastatin 20 MG tablet Commonly known as: MEVACOR Take 20 mg by mouth daily.   metFORMIN 1000 MG tablet Commonly known as: GLUCOPHAGE Take 1,000 mg by mouth 2 (two) times daily with a meal.   multivitamin with minerals tablet Take 1 tablet by mouth daily.   oxyCODONE 5 MG immediate release tablet Commonly known as: Oxy IR/ROXICODONE Take one tab po q4-6hrs prn pain, may need 1-2 first couple weeks   telmisartan 40 MG tablet Commonly known as: MICARDIS Take 40 mg by mouth daily.   Vitamin D3 250 MCG (10000 UT) capsule Take 10,000 Units by mouth daily.       FOLLOW UP VISIT:   Follow-up Information    Earlie Server, MD. Go on 11/15/2018.   Specialty: Orthopedic Surgery Why: Your appointment has been scheduled for 12:15.  Contact information: [place] [address] [phone]        [facility]   Specialty: [place] Why: You will be seen at home by [facility] for 5 visits prior to starting outpatient physical therapy  Contact information: [address] [place] [address] [phone]        [facility], [place]. Go on 11/15/2018.   Why: You are scheduled to start outpatient physical therapy at 11:00. Please arrive at 10:30 to complete your paperwork.  Contact information: [facility] [place] [address] [phone]           DISPOSITION:   Home  CONDITION:  Stable   Lendon Ka. French Ana, MD  11/09/2018 10:21 AM

## 2018-11-12 DIAGNOSIS — E669 Obesity, unspecified: Secondary | ICD-10-CM | POA: Diagnosis not present

## 2018-11-12 DIAGNOSIS — I10 Essential (primary) hypertension: Secondary | ICD-10-CM | POA: Diagnosis not present

## 2018-11-12 DIAGNOSIS — E119 Type 2 diabetes mellitus without complications: Secondary | ICD-10-CM | POA: Diagnosis not present

## 2018-11-12 DIAGNOSIS — E785 Hyperlipidemia, unspecified: Secondary | ICD-10-CM | POA: Diagnosis not present

## 2018-11-12 DIAGNOSIS — Z6834 Body mass index (BMI) 34.0-34.9, adult: Secondary | ICD-10-CM | POA: Diagnosis not present

## 2018-11-12 DIAGNOSIS — Z471 Aftercare following joint replacement surgery: Secondary | ICD-10-CM | POA: Diagnosis not present

## 2018-11-15 DIAGNOSIS — M6281 Muscle weakness (generalized): Secondary | ICD-10-CM | POA: Diagnosis not present

## 2018-11-15 DIAGNOSIS — Z471 Aftercare following joint replacement surgery: Secondary | ICD-10-CM | POA: Diagnosis not present

## 2018-11-15 DIAGNOSIS — M1712 Unilateral primary osteoarthritis, left knee: Secondary | ICD-10-CM | POA: Diagnosis not present

## 2018-11-15 DIAGNOSIS — R262 Difficulty in walking, not elsewhere classified: Secondary | ICD-10-CM | POA: Diagnosis not present

## 2018-11-21 DIAGNOSIS — M6281 Muscle weakness (generalized): Secondary | ICD-10-CM | POA: Diagnosis not present

## 2018-11-21 DIAGNOSIS — R262 Difficulty in walking, not elsewhere classified: Secondary | ICD-10-CM | POA: Diagnosis not present

## 2018-11-21 DIAGNOSIS — M1712 Unilateral primary osteoarthritis, left knee: Secondary | ICD-10-CM | POA: Diagnosis not present

## 2018-11-21 DIAGNOSIS — Z471 Aftercare following joint replacement surgery: Secondary | ICD-10-CM | POA: Diagnosis not present

## 2018-11-23 DIAGNOSIS — M6281 Muscle weakness (generalized): Secondary | ICD-10-CM | POA: Diagnosis not present

## 2018-11-23 DIAGNOSIS — R262 Difficulty in walking, not elsewhere classified: Secondary | ICD-10-CM | POA: Diagnosis not present

## 2018-11-23 DIAGNOSIS — M1712 Unilateral primary osteoarthritis, left knee: Secondary | ICD-10-CM | POA: Diagnosis not present

## 2018-11-23 DIAGNOSIS — Z471 Aftercare following joint replacement surgery: Secondary | ICD-10-CM | POA: Diagnosis not present

## 2018-11-26 DIAGNOSIS — R262 Difficulty in walking, not elsewhere classified: Secondary | ICD-10-CM | POA: Diagnosis not present

## 2018-11-26 DIAGNOSIS — M6281 Muscle weakness (generalized): Secondary | ICD-10-CM | POA: Diagnosis not present

## 2018-11-26 DIAGNOSIS — Z471 Aftercare following joint replacement surgery: Secondary | ICD-10-CM | POA: Diagnosis not present

## 2018-11-26 DIAGNOSIS — M1712 Unilateral primary osteoarthritis, left knee: Secondary | ICD-10-CM | POA: Diagnosis not present

## 2018-11-28 DIAGNOSIS — Z471 Aftercare following joint replacement surgery: Secondary | ICD-10-CM | POA: Diagnosis not present

## 2018-11-28 DIAGNOSIS — R262 Difficulty in walking, not elsewhere classified: Secondary | ICD-10-CM | POA: Diagnosis not present

## 2018-11-28 DIAGNOSIS — M6281 Muscle weakness (generalized): Secondary | ICD-10-CM | POA: Diagnosis not present

## 2018-11-28 DIAGNOSIS — M1712 Unilateral primary osteoarthritis, left knee: Secondary | ICD-10-CM | POA: Diagnosis not present

## 2018-12-03 DIAGNOSIS — Z471 Aftercare following joint replacement surgery: Secondary | ICD-10-CM | POA: Diagnosis not present

## 2018-12-03 DIAGNOSIS — M1712 Unilateral primary osteoarthritis, left knee: Secondary | ICD-10-CM | POA: Diagnosis not present

## 2018-12-03 DIAGNOSIS — M6281 Muscle weakness (generalized): Secondary | ICD-10-CM | POA: Diagnosis not present

## 2018-12-03 DIAGNOSIS — R262 Difficulty in walking, not elsewhere classified: Secondary | ICD-10-CM | POA: Diagnosis not present

## 2018-12-05 DIAGNOSIS — M1712 Unilateral primary osteoarthritis, left knee: Secondary | ICD-10-CM | POA: Diagnosis not present

## 2018-12-05 DIAGNOSIS — M6281 Muscle weakness (generalized): Secondary | ICD-10-CM | POA: Diagnosis not present

## 2018-12-05 DIAGNOSIS — R262 Difficulty in walking, not elsewhere classified: Secondary | ICD-10-CM | POA: Diagnosis not present

## 2018-12-05 DIAGNOSIS — Z471 Aftercare following joint replacement surgery: Secondary | ICD-10-CM | POA: Diagnosis not present

## 2018-12-07 DIAGNOSIS — Z6841 Body Mass Index (BMI) 40.0 and over, adult: Secondary | ICD-10-CM | POA: Diagnosis not present

## 2018-12-07 DIAGNOSIS — Z713 Dietary counseling and surveillance: Secondary | ICD-10-CM | POA: Diagnosis not present

## 2018-12-10 DIAGNOSIS — R262 Difficulty in walking, not elsewhere classified: Secondary | ICD-10-CM | POA: Diagnosis not present

## 2018-12-10 DIAGNOSIS — Z471 Aftercare following joint replacement surgery: Secondary | ICD-10-CM | POA: Diagnosis not present

## 2018-12-10 DIAGNOSIS — M6281 Muscle weakness (generalized): Secondary | ICD-10-CM | POA: Diagnosis not present

## 2018-12-10 DIAGNOSIS — M1712 Unilateral primary osteoarthritis, left knee: Secondary | ICD-10-CM | POA: Diagnosis not present

## 2018-12-12 DIAGNOSIS — Z471 Aftercare following joint replacement surgery: Secondary | ICD-10-CM | POA: Diagnosis not present

## 2018-12-12 DIAGNOSIS — R262 Difficulty in walking, not elsewhere classified: Secondary | ICD-10-CM | POA: Diagnosis not present

## 2018-12-12 DIAGNOSIS — M1712 Unilateral primary osteoarthritis, left knee: Secondary | ICD-10-CM | POA: Diagnosis not present

## 2018-12-12 DIAGNOSIS — M6281 Muscle weakness (generalized): Secondary | ICD-10-CM | POA: Diagnosis not present

## 2018-12-13 DIAGNOSIS — M1712 Unilateral primary osteoarthritis, left knee: Secondary | ICD-10-CM | POA: Diagnosis not present

## 2019-02-01 DIAGNOSIS — Z794 Long term (current) use of insulin: Secondary | ICD-10-CM | POA: Diagnosis not present

## 2019-02-01 DIAGNOSIS — I10 Essential (primary) hypertension: Secondary | ICD-10-CM | POA: Diagnosis not present

## 2019-02-01 DIAGNOSIS — Z6841 Body Mass Index (BMI) 40.0 and over, adult: Secondary | ICD-10-CM | POA: Diagnosis not present

## 2019-02-01 DIAGNOSIS — E1169 Type 2 diabetes mellitus with other specified complication: Secondary | ICD-10-CM | POA: Diagnosis not present

## 2019-02-01 DIAGNOSIS — E119 Type 2 diabetes mellitus without complications: Secondary | ICD-10-CM | POA: Diagnosis not present

## 2019-02-01 DIAGNOSIS — M170 Bilateral primary osteoarthritis of knee: Secondary | ICD-10-CM | POA: Diagnosis not present

## 2019-02-01 DIAGNOSIS — M1712 Unilateral primary osteoarthritis, left knee: Secondary | ICD-10-CM | POA: Diagnosis not present

## 2019-02-01 DIAGNOSIS — E7800 Pure hypercholesterolemia, unspecified: Secondary | ICD-10-CM | POA: Diagnosis not present

## 2019-02-04 DIAGNOSIS — I10 Essential (primary) hypertension: Secondary | ICD-10-CM | POA: Diagnosis not present

## 2019-02-04 DIAGNOSIS — R634 Abnormal weight loss: Secondary | ICD-10-CM | POA: Diagnosis not present

## 2019-02-04 DIAGNOSIS — M170 Bilateral primary osteoarthritis of knee: Secondary | ICD-10-CM | POA: Diagnosis not present

## 2019-02-04 DIAGNOSIS — E7800 Pure hypercholesterolemia, unspecified: Secondary | ICD-10-CM | POA: Diagnosis not present

## 2019-02-04 DIAGNOSIS — E1169 Type 2 diabetes mellitus with other specified complication: Secondary | ICD-10-CM | POA: Diagnosis not present

## 2019-02-04 DIAGNOSIS — Z794 Long term (current) use of insulin: Secondary | ICD-10-CM | POA: Diagnosis not present

## 2019-02-04 DIAGNOSIS — Z0000 Encounter for general adult medical examination without abnormal findings: Secondary | ICD-10-CM | POA: Diagnosis not present

## 2019-02-14 DIAGNOSIS — M1712 Unilateral primary osteoarthritis, left knee: Secondary | ICD-10-CM | POA: Diagnosis not present

## 2019-03-18 DIAGNOSIS — E1169 Type 2 diabetes mellitus with other specified complication: Secondary | ICD-10-CM | POA: Diagnosis not present

## 2019-03-18 DIAGNOSIS — M170 Bilateral primary osteoarthritis of knee: Secondary | ICD-10-CM | POA: Diagnosis not present

## 2019-03-18 DIAGNOSIS — M1712 Unilateral primary osteoarthritis, left knee: Secondary | ICD-10-CM | POA: Diagnosis not present

## 2019-03-18 DIAGNOSIS — E7800 Pure hypercholesterolemia, unspecified: Secondary | ICD-10-CM | POA: Diagnosis not present

## 2019-03-18 DIAGNOSIS — Z7984 Long term (current) use of oral hypoglycemic drugs: Secondary | ICD-10-CM | POA: Diagnosis not present

## 2019-03-18 DIAGNOSIS — I10 Essential (primary) hypertension: Secondary | ICD-10-CM | POA: Diagnosis not present

## 2019-04-08 DIAGNOSIS — H2513 Age-related nuclear cataract, bilateral: Secondary | ICD-10-CM | POA: Diagnosis not present

## 2019-04-13 ENCOUNTER — Other Ambulatory Visit: Payer: Self-pay

## 2019-04-13 ENCOUNTER — Ambulatory Visit: Payer: Medicare Other | Attending: Internal Medicine

## 2019-04-13 DIAGNOSIS — Z23 Encounter for immunization: Secondary | ICD-10-CM

## 2019-04-13 NOTE — Progress Notes (Signed)
   Covid-19 Vaccination Clinic  Name:  MARIELY BALYEAT    MRN: [id number] DOB: 1945-08-25  04/13/2019  Ms. Laramee was observed post Covid-19 immunization for 15 minutes without incident. She was provided with Vaccine Information Sheet and instruction to access the V-Safe system.   Ms. Deveraux was instructed to call 911 with any severe reactions post vaccine: Marland Kitchen Difficulty breathing  . Swelling of face and throat  . A fast heartbeat  . A bad rash all over body  . Dizziness and weakness   Immunizations Administered    Name Date Dose VIS Date Route   Pfizer COVID-19 Vaccine 04/13/2019 12:43 PM 0.3 mL 12/14/2018 Intramuscular   Manufacturer: [place]   Lot: [id number]   [place]: [id number]

## 2019-05-03 DIAGNOSIS — E669 Obesity, unspecified: Secondary | ICD-10-CM | POA: Diagnosis not present

## 2019-05-03 DIAGNOSIS — Z7984 Long term (current) use of oral hypoglycemic drugs: Secondary | ICD-10-CM | POA: Diagnosis not present

## 2019-05-03 DIAGNOSIS — E119 Type 2 diabetes mellitus without complications: Secondary | ICD-10-CM | POA: Diagnosis not present

## 2019-05-03 DIAGNOSIS — Z6830 Body mass index (BMI) 30.0-30.9, adult: Secondary | ICD-10-CM | POA: Diagnosis not present

## 2019-05-03 DIAGNOSIS — Z794 Long term (current) use of insulin: Secondary | ICD-10-CM | POA: Diagnosis not present

## 2019-05-08 ENCOUNTER — Ambulatory Visit: Payer: Medicare Other | Attending: Internal Medicine

## 2019-05-08 DIAGNOSIS — Z23 Encounter for immunization: Secondary | ICD-10-CM

## 2019-05-08 NOTE — Progress Notes (Signed)
   Covid-19 Vaccination Clinic  Name:  Victoria Reeves    MRN: [id number] DOB: Jun 09, 1945  05/08/2019  Ms. Victoria Reeves was observed post Covid-19 immunization for 15 minutes without incident. She was provided with Vaccine Information Sheet and instruction to access the V-Safe system.   Victoria Reeves was instructed to call 911 with any severe reactions post vaccine: Marland Kitchen Difficulty breathing  . Swelling of face and throat  . A fast heartbeat  . A bad rash all over body  . Dizziness and weakness   Immunizations Administered    Name Date Dose VIS Date Route   Pfizer COVID-19 Vaccine 05/08/2019 11:47 AM 0.3 mL 02/27/2018 Intramuscular   Manufacturer: [place]   Lot: [id number]   [place]: [id number]

## 2019-05-15 DIAGNOSIS — Z794 Long term (current) use of insulin: Secondary | ICD-10-CM | POA: Diagnosis not present

## 2019-05-15 DIAGNOSIS — E119 Type 2 diabetes mellitus without complications: Secondary | ICD-10-CM | POA: Diagnosis not present

## 2019-05-15 DIAGNOSIS — Z6832 Body mass index (BMI) 32.0-32.9, adult: Secondary | ICD-10-CM | POA: Diagnosis not present

## 2019-05-15 DIAGNOSIS — H25813 Combined forms of age-related cataract, bilateral: Secondary | ICD-10-CM | POA: Diagnosis not present

## 2019-06-18 DIAGNOSIS — E1169 Type 2 diabetes mellitus with other specified complication: Secondary | ICD-10-CM | POA: Diagnosis not present

## 2019-06-18 DIAGNOSIS — E7800 Pure hypercholesterolemia, unspecified: Secondary | ICD-10-CM | POA: Diagnosis not present

## 2019-06-18 DIAGNOSIS — I10 Essential (primary) hypertension: Secondary | ICD-10-CM | POA: Diagnosis not present

## 2019-06-18 DIAGNOSIS — M170 Bilateral primary osteoarthritis of knee: Secondary | ICD-10-CM | POA: Diagnosis not present

## 2019-06-18 DIAGNOSIS — M1712 Unilateral primary osteoarthritis, left knee: Secondary | ICD-10-CM | POA: Diagnosis not present

## 2019-06-21 DIAGNOSIS — H25812 Combined forms of age-related cataract, left eye: Secondary | ICD-10-CM | POA: Diagnosis not present

## 2019-07-02 DIAGNOSIS — H25811 Combined forms of age-related cataract, right eye: Secondary | ICD-10-CM | POA: Diagnosis not present

## 2019-07-02 DIAGNOSIS — Z9842 Cataract extraction status, left eye: Secondary | ICD-10-CM | POA: Diagnosis not present

## 2019-07-02 DIAGNOSIS — H35342 Macular cyst, hole, or pseudohole, left eye: Secondary | ICD-10-CM | POA: Diagnosis not present

## 2019-07-02 DIAGNOSIS — Z961 Presence of intraocular lens: Secondary | ICD-10-CM | POA: Diagnosis not present

## 2019-07-11 DIAGNOSIS — Z48810 Encounter for surgical aftercare following surgery on the sense organs: Secondary | ICD-10-CM | POA: Diagnosis not present

## 2019-07-11 DIAGNOSIS — Z961 Presence of intraocular lens: Secondary | ICD-10-CM | POA: Diagnosis not present

## 2019-07-11 DIAGNOSIS — Z7952 Long term (current) use of systemic steroids: Secondary | ICD-10-CM | POA: Diagnosis not present

## 2019-07-11 DIAGNOSIS — Z79899 Other long term (current) drug therapy: Secondary | ICD-10-CM | POA: Diagnosis not present

## 2019-07-11 DIAGNOSIS — H35342 Macular cyst, hole, or pseudohole, left eye: Secondary | ICD-10-CM | POA: Diagnosis not present

## 2019-07-11 DIAGNOSIS — H2511 Age-related nuclear cataract, right eye: Secondary | ICD-10-CM | POA: Diagnosis not present

## 2019-08-13 DIAGNOSIS — H35342 Macular cyst, hole, or pseudohole, left eye: Secondary | ICD-10-CM | POA: Diagnosis not present

## 2019-08-22 DIAGNOSIS — M170 Bilateral primary osteoarthritis of knee: Secondary | ICD-10-CM | POA: Diagnosis not present

## 2019-08-22 DIAGNOSIS — E119 Type 2 diabetes mellitus without complications: Secondary | ICD-10-CM | POA: Diagnosis not present

## 2019-08-22 DIAGNOSIS — I10 Essential (primary) hypertension: Secondary | ICD-10-CM | POA: Diagnosis not present

## 2019-08-22 DIAGNOSIS — E1169 Type 2 diabetes mellitus with other specified complication: Secondary | ICD-10-CM | POA: Diagnosis not present

## 2019-08-22 DIAGNOSIS — M1712 Unilateral primary osteoarthritis, left knee: Secondary | ICD-10-CM | POA: Diagnosis not present

## 2019-08-22 DIAGNOSIS — E7800 Pure hypercholesterolemia, unspecified: Secondary | ICD-10-CM | POA: Diagnosis not present

## 2019-09-06 DIAGNOSIS — E119 Type 2 diabetes mellitus without complications: Secondary | ICD-10-CM | POA: Diagnosis not present

## 2019-09-06 DIAGNOSIS — Z6831 Body mass index (BMI) 31.0-31.9, adult: Secondary | ICD-10-CM | POA: Diagnosis not present

## 2019-09-06 DIAGNOSIS — E669 Obesity, unspecified: Secondary | ICD-10-CM | POA: Diagnosis not present

## 2019-09-06 DIAGNOSIS — E785 Hyperlipidemia, unspecified: Secondary | ICD-10-CM | POA: Diagnosis not present

## 2019-09-11 DIAGNOSIS — E119 Type 2 diabetes mellitus without complications: Secondary | ICD-10-CM | POA: Diagnosis not present

## 2019-09-11 DIAGNOSIS — E785 Hyperlipidemia, unspecified: Secondary | ICD-10-CM | POA: Diagnosis not present

## 2019-09-13 DIAGNOSIS — Z23 Encounter for immunization: Secondary | ICD-10-CM | POA: Diagnosis not present

## 2019-10-08 DIAGNOSIS — H2511 Age-related nuclear cataract, right eye: Secondary | ICD-10-CM | POA: Diagnosis not present

## 2019-10-08 DIAGNOSIS — Z9889 Other specified postprocedural states: Secondary | ICD-10-CM | POA: Diagnosis not present

## 2019-10-08 DIAGNOSIS — Z9842 Cataract extraction status, left eye: Secondary | ICD-10-CM | POA: Diagnosis not present

## 2019-10-08 DIAGNOSIS — Z48810 Encounter for surgical aftercare following surgery on the sense organs: Secondary | ICD-10-CM | POA: Diagnosis not present

## 2019-10-08 DIAGNOSIS — H35342 Macular cyst, hole, or pseudohole, left eye: Secondary | ICD-10-CM | POA: Diagnosis not present

## 2019-11-22 DIAGNOSIS — Z23 Encounter for immunization: Secondary | ICD-10-CM | POA: Diagnosis not present

## 2019-11-26 DIAGNOSIS — E119 Type 2 diabetes mellitus without complications: Secondary | ICD-10-CM | POA: Diagnosis not present

## 2019-11-26 DIAGNOSIS — M170 Bilateral primary osteoarthritis of knee: Secondary | ICD-10-CM | POA: Diagnosis not present

## 2019-11-26 DIAGNOSIS — M1712 Unilateral primary osteoarthritis, left knee: Secondary | ICD-10-CM | POA: Diagnosis not present

## 2019-11-26 DIAGNOSIS — E7800 Pure hypercholesterolemia, unspecified: Secondary | ICD-10-CM | POA: Diagnosis not present

## 2019-11-26 DIAGNOSIS — E1169 Type 2 diabetes mellitus with other specified complication: Secondary | ICD-10-CM | POA: Diagnosis not present

## 2019-11-26 DIAGNOSIS — I10 Essential (primary) hypertension: Secondary | ICD-10-CM | POA: Diagnosis not present

## 2019-12-18 DIAGNOSIS — H2511 Age-related nuclear cataract, right eye: Secondary | ICD-10-CM | POA: Diagnosis not present

## 2019-12-18 DIAGNOSIS — H35342 Macular cyst, hole, or pseudohole, left eye: Secondary | ICD-10-CM | POA: Diagnosis not present

## 2019-12-18 DIAGNOSIS — E119 Type 2 diabetes mellitus without complications: Secondary | ICD-10-CM | POA: Diagnosis not present

## 2019-12-18 DIAGNOSIS — Z9842 Cataract extraction status, left eye: Secondary | ICD-10-CM | POA: Diagnosis not present

## 2020-01-10 DIAGNOSIS — E669 Obesity, unspecified: Secondary | ICD-10-CM | POA: Diagnosis not present

## 2020-01-10 DIAGNOSIS — E785 Hyperlipidemia, unspecified: Secondary | ICD-10-CM | POA: Diagnosis not present

## 2020-01-10 DIAGNOSIS — E119 Type 2 diabetes mellitus without complications: Secondary | ICD-10-CM | POA: Diagnosis not present

## 2020-01-10 DIAGNOSIS — I10 Essential (primary) hypertension: Secondary | ICD-10-CM | POA: Diagnosis not present

## 2020-01-29 DIAGNOSIS — E7800 Pure hypercholesterolemia, unspecified: Secondary | ICD-10-CM | POA: Diagnosis not present

## 2020-01-29 DIAGNOSIS — M170 Bilateral primary osteoarthritis of knee: Secondary | ICD-10-CM | POA: Diagnosis not present

## 2020-01-29 DIAGNOSIS — I10 Essential (primary) hypertension: Secondary | ICD-10-CM | POA: Diagnosis not present

## 2020-01-29 DIAGNOSIS — E1169 Type 2 diabetes mellitus with other specified complication: Secondary | ICD-10-CM | POA: Diagnosis not present

## 2020-01-29 DIAGNOSIS — M1712 Unilateral primary osteoarthritis, left knee: Secondary | ICD-10-CM | POA: Diagnosis not present

## 2020-02-05 DIAGNOSIS — Z1389 Encounter for screening for other disorder: Secondary | ICD-10-CM | POA: Diagnosis not present

## 2020-02-05 DIAGNOSIS — Z0000 Encounter for general adult medical examination without abnormal findings: Secondary | ICD-10-CM | POA: Diagnosis not present

## 2020-02-05 DIAGNOSIS — Z23 Encounter for immunization: Secondary | ICD-10-CM | POA: Diagnosis not present

## 2020-02-07 ENCOUNTER — Other Ambulatory Visit: Payer: Self-pay | Admitting: Family Medicine

## 2020-02-07 DIAGNOSIS — Z1231 Encounter for screening mammogram for malignant neoplasm of breast: Secondary | ICD-10-CM

## 2020-02-12 ENCOUNTER — Ambulatory Visit
Admission: RE | Admit: 2020-02-12 | Discharge: 2020-02-12 | Disposition: A | Discharge status: Home / Self Care | Payer: Medicare Other | Source: Ambulatory Visit | Attending: Family Medicine | Admitting: Family Medicine

## 2020-02-12 ENCOUNTER — Other Ambulatory Visit: Payer: Self-pay

## 2020-02-12 DIAGNOSIS — Z1231 Encounter for screening mammogram for malignant neoplasm of breast: Secondary | ICD-10-CM | POA: Diagnosis not present

## 2020-02-18 ENCOUNTER — Other Ambulatory Visit: Payer: Self-pay | Admitting: Family Medicine

## 2020-02-18 DIAGNOSIS — R928 Other abnormal and inconclusive findings on diagnostic imaging of breast: Secondary | ICD-10-CM

## 2020-02-18 DIAGNOSIS — H2511 Age-related nuclear cataract, right eye: Secondary | ICD-10-CM | POA: Diagnosis not present

## 2020-02-19 DIAGNOSIS — E1169 Type 2 diabetes mellitus with other specified complication: Secondary | ICD-10-CM | POA: Diagnosis not present

## 2020-02-19 DIAGNOSIS — E7800 Pure hypercholesterolemia, unspecified: Secondary | ICD-10-CM | POA: Diagnosis not present

## 2020-02-19 DIAGNOSIS — M1712 Unilateral primary osteoarthritis, left knee: Secondary | ICD-10-CM | POA: Diagnosis not present

## 2020-02-19 DIAGNOSIS — M25562 Pain in left knee: Secondary | ICD-10-CM | POA: Diagnosis not present

## 2020-02-19 DIAGNOSIS — M170 Bilateral primary osteoarthritis of knee: Secondary | ICD-10-CM | POA: Diagnosis not present

## 2020-02-19 DIAGNOSIS — I10 Essential (primary) hypertension: Secondary | ICD-10-CM | POA: Diagnosis not present

## 2020-02-27 DIAGNOSIS — H25811 Combined forms of age-related cataract, right eye: Secondary | ICD-10-CM | POA: Diagnosis not present

## 2020-02-28 DIAGNOSIS — Z7984 Long term (current) use of oral hypoglycemic drugs: Secondary | ICD-10-CM | POA: Diagnosis not present

## 2020-02-28 DIAGNOSIS — H35342 Macular cyst, hole, or pseudohole, left eye: Secondary | ICD-10-CM | POA: Diagnosis not present

## 2020-02-28 DIAGNOSIS — Z48810 Encounter for surgical aftercare following surgery on the sense organs: Secondary | ICD-10-CM | POA: Diagnosis not present

## 2020-02-28 DIAGNOSIS — E1136 Type 2 diabetes mellitus with diabetic cataract: Secondary | ICD-10-CM | POA: Diagnosis not present

## 2020-03-03 DIAGNOSIS — Z9842 Cataract extraction status, left eye: Secondary | ICD-10-CM | POA: Diagnosis not present

## 2020-03-03 DIAGNOSIS — Z961 Presence of intraocular lens: Secondary | ICD-10-CM | POA: Diagnosis not present

## 2020-03-03 DIAGNOSIS — Z9841 Cataract extraction status, right eye: Secondary | ICD-10-CM | POA: Diagnosis not present

## 2020-03-03 DIAGNOSIS — H35342 Macular cyst, hole, or pseudohole, left eye: Secondary | ICD-10-CM | POA: Diagnosis not present

## 2020-03-09 ENCOUNTER — Ambulatory Visit
Admission: RE | Admit: 2020-03-09 | Discharge: 2020-03-09 | Disposition: A | Discharge status: Home / Self Care | Payer: Medicare Other | Source: Ambulatory Visit | Attending: Family Medicine | Admitting: Family Medicine

## 2020-03-09 ENCOUNTER — Other Ambulatory Visit: Payer: Self-pay

## 2020-03-09 ENCOUNTER — Ambulatory Visit: Payer: Medicare Other

## 2020-03-09 DIAGNOSIS — R928 Other abnormal and inconclusive findings on diagnostic imaging of breast: Secondary | ICD-10-CM | POA: Diagnosis not present

## 2020-03-09 DIAGNOSIS — R922 Inconclusive mammogram: Secondary | ICD-10-CM | POA: Diagnosis not present

## 2020-04-07 DIAGNOSIS — I10 Essential (primary) hypertension: Secondary | ICD-10-CM | POA: Diagnosis not present

## 2020-04-07 DIAGNOSIS — E119 Type 2 diabetes mellitus without complications: Secondary | ICD-10-CM | POA: Diagnosis not present

## 2020-04-07 DIAGNOSIS — E669 Obesity, unspecified: Secondary | ICD-10-CM | POA: Diagnosis not present

## 2020-06-23 DIAGNOSIS — E1169 Type 2 diabetes mellitus with other specified complication: Secondary | ICD-10-CM | POA: Diagnosis not present

## 2020-06-23 DIAGNOSIS — M170 Bilateral primary osteoarthritis of knee: Secondary | ICD-10-CM | POA: Diagnosis not present

## 2020-06-23 DIAGNOSIS — I10 Essential (primary) hypertension: Secondary | ICD-10-CM | POA: Diagnosis not present

## 2020-06-23 DIAGNOSIS — M1712 Unilateral primary osteoarthritis, left knee: Secondary | ICD-10-CM | POA: Diagnosis not present

## 2020-06-23 DIAGNOSIS — E7800 Pure hypercholesterolemia, unspecified: Secondary | ICD-10-CM | POA: Diagnosis not present

## 2020-08-20 DIAGNOSIS — E1169 Type 2 diabetes mellitus with other specified complication: Secondary | ICD-10-CM | POA: Diagnosis not present

## 2020-08-20 DIAGNOSIS — I10 Essential (primary) hypertension: Secondary | ICD-10-CM | POA: Diagnosis not present

## 2020-08-20 DIAGNOSIS — M170 Bilateral primary osteoarthritis of knee: Secondary | ICD-10-CM | POA: Diagnosis not present

## 2020-08-20 DIAGNOSIS — E7800 Pure hypercholesterolemia, unspecified: Secondary | ICD-10-CM | POA: Diagnosis not present

## 2020-09-18 DIAGNOSIS — Z961 Presence of intraocular lens: Secondary | ICD-10-CM | POA: Diagnosis not present

## 2020-09-18 DIAGNOSIS — Z9842 Cataract extraction status, left eye: Secondary | ICD-10-CM | POA: Diagnosis not present

## 2020-09-18 DIAGNOSIS — H35342 Macular cyst, hole, or pseudohole, left eye: Secondary | ICD-10-CM | POA: Diagnosis not present

## 2020-09-18 DIAGNOSIS — Z9841 Cataract extraction status, right eye: Secondary | ICD-10-CM | POA: Diagnosis not present

## 2020-09-22 DIAGNOSIS — M170 Bilateral primary osteoarthritis of knee: Secondary | ICD-10-CM | POA: Diagnosis not present

## 2020-09-22 DIAGNOSIS — E7800 Pure hypercholesterolemia, unspecified: Secondary | ICD-10-CM | POA: Diagnosis not present

## 2020-09-22 DIAGNOSIS — E1169 Type 2 diabetes mellitus with other specified complication: Secondary | ICD-10-CM | POA: Diagnosis not present

## 2020-09-22 DIAGNOSIS — I10 Essential (primary) hypertension: Secondary | ICD-10-CM | POA: Diagnosis not present

## 2020-10-13 DIAGNOSIS — E119 Type 2 diabetes mellitus without complications: Secondary | ICD-10-CM | POA: Diagnosis not present

## 2020-10-13 DIAGNOSIS — E669 Obesity, unspecified: Secondary | ICD-10-CM | POA: Diagnosis not present

## 2020-10-13 DIAGNOSIS — I10 Essential (primary) hypertension: Secondary | ICD-10-CM | POA: Diagnosis not present

## 2021-02-09 DIAGNOSIS — M170 Bilateral primary osteoarthritis of knee: Secondary | ICD-10-CM | POA: Diagnosis not present

## 2021-02-09 DIAGNOSIS — Z1389 Encounter for screening for other disorder: Secondary | ICD-10-CM | POA: Diagnosis not present

## 2021-02-09 DIAGNOSIS — Z0000 Encounter for general adult medical examination without abnormal findings: Secondary | ICD-10-CM | POA: Diagnosis not present

## 2021-02-09 DIAGNOSIS — E1169 Type 2 diabetes mellitus with other specified complication: Secondary | ICD-10-CM | POA: Diagnosis not present

## 2021-02-09 DIAGNOSIS — E7800 Pure hypercholesterolemia, unspecified: Secondary | ICD-10-CM | POA: Diagnosis not present

## 2021-02-09 DIAGNOSIS — I10 Essential (primary) hypertension: Secondary | ICD-10-CM | POA: Diagnosis not present

## 2021-02-09 DIAGNOSIS — Z23 Encounter for immunization: Secondary | ICD-10-CM | POA: Diagnosis not present

## 2021-03-02 ENCOUNTER — Other Ambulatory Visit: Payer: Self-pay | Admitting: Family Medicine

## 2021-03-02 DIAGNOSIS — Z1231 Encounter for screening mammogram for malignant neoplasm of breast: Secondary | ICD-10-CM

## 2021-03-31 ENCOUNTER — Ambulatory Visit
Admission: RE | Admit: 2021-03-31 | Discharge: 2021-03-31 | Disposition: A | Discharge status: Home / Self Care | Payer: Medicare Other | Source: Ambulatory Visit | Attending: Family Medicine | Admitting: Family Medicine

## 2021-03-31 DIAGNOSIS — Z1231 Encounter for screening mammogram for malignant neoplasm of breast: Secondary | ICD-10-CM

## 2021-05-17 IMAGING — MG MM DIGITAL SCREENING BILAT W/ TOMO AND CAD
6 of 10 series · 6 of 30 positions shown · non-contrast
Comparison: Previous exam(s).

CLINICAL DATA: Screening.

EXAM:
DIGITAL SCREENING BILATERAL MAMMOGRAM WITH TOMOSYNTHESIS AND CAD
TECHNIQUE: Bilateral screening digital craniocaudal and mediolateral oblique
mammograms were obtained. Bilateral screening digital breast
tomosynthesis was performed. The images were evaluated with
computer-aided detection.

[L MLO synth-2D]
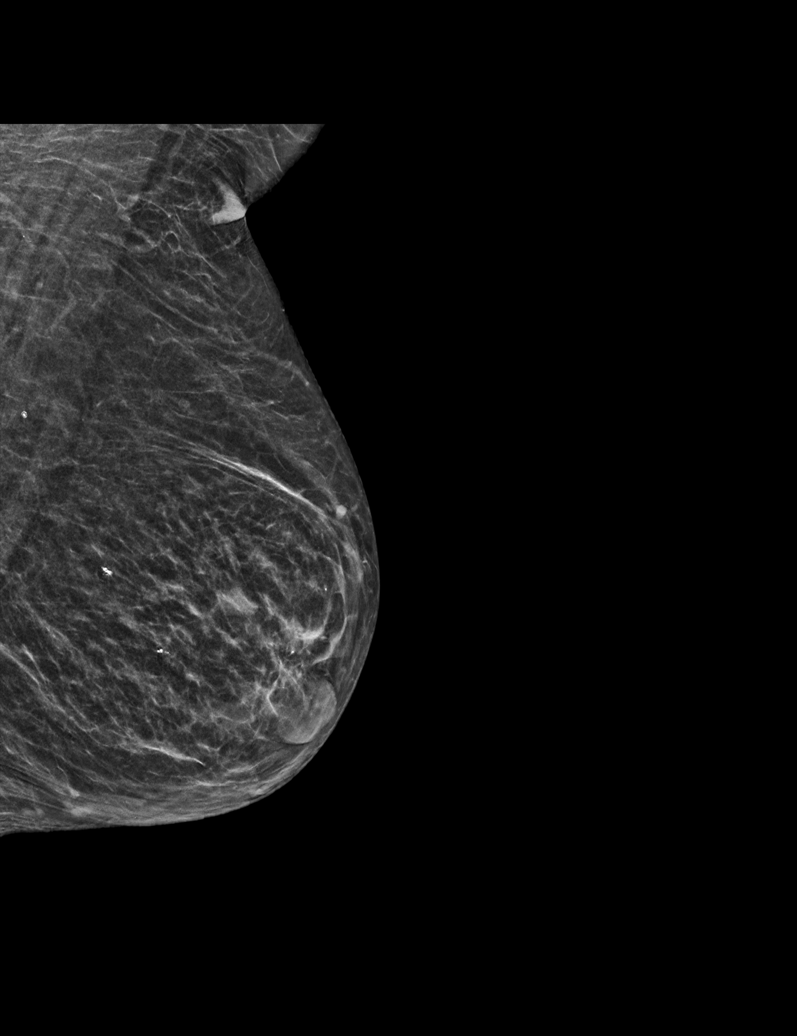

[R CC synth-2D]
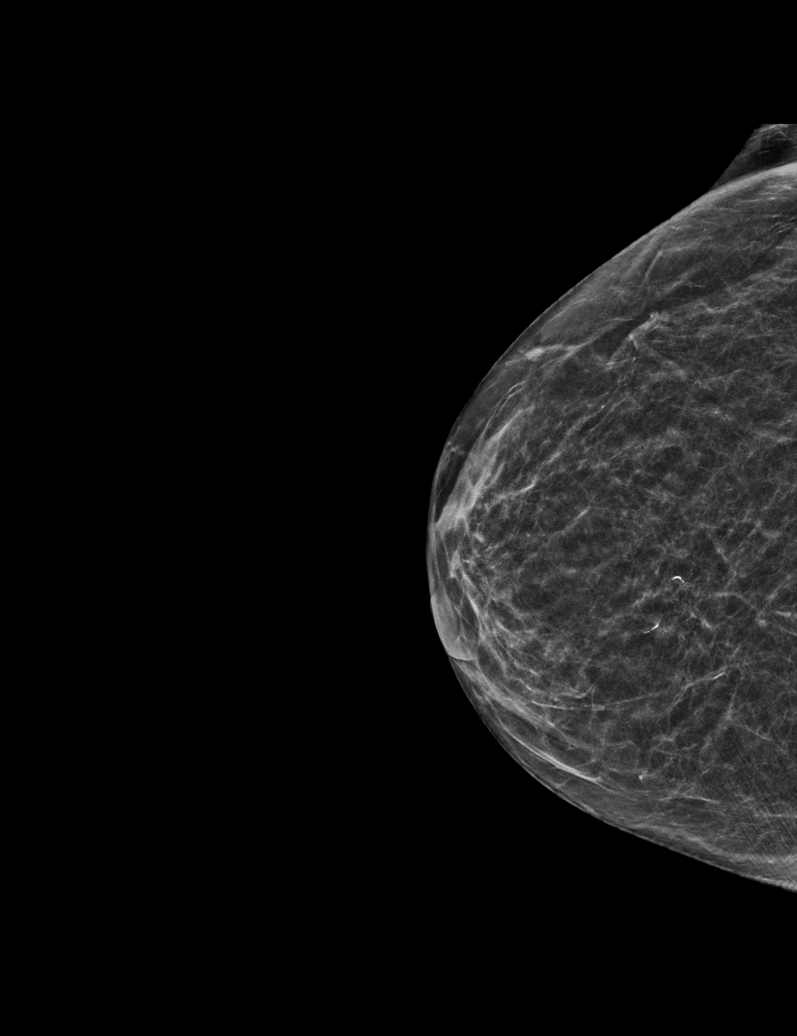

[R MLO synth-2D (1 of 2)]
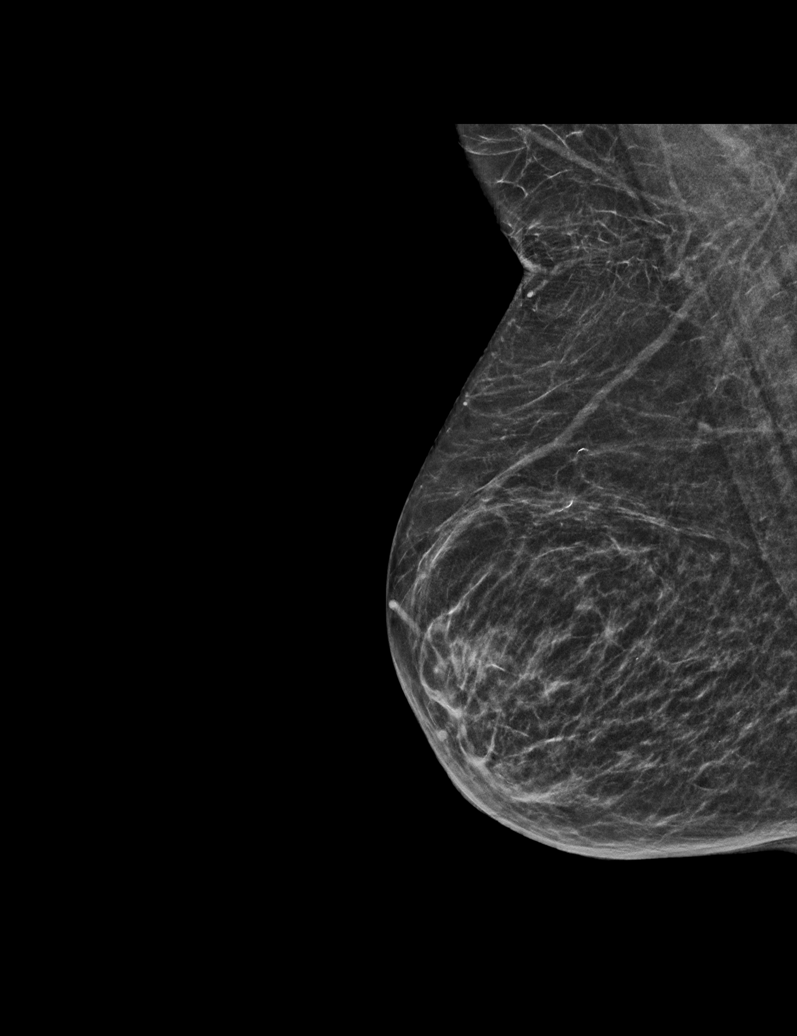

[R MLO synth-2D (2 of 2)]
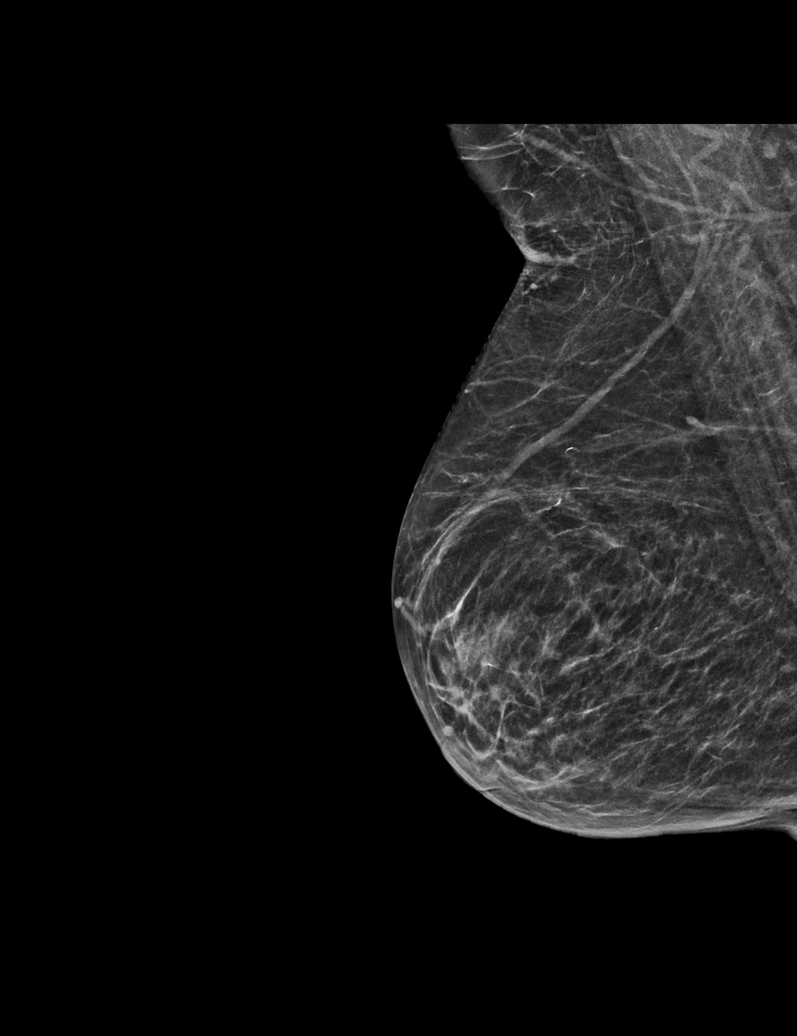

[L CC synth-2D]
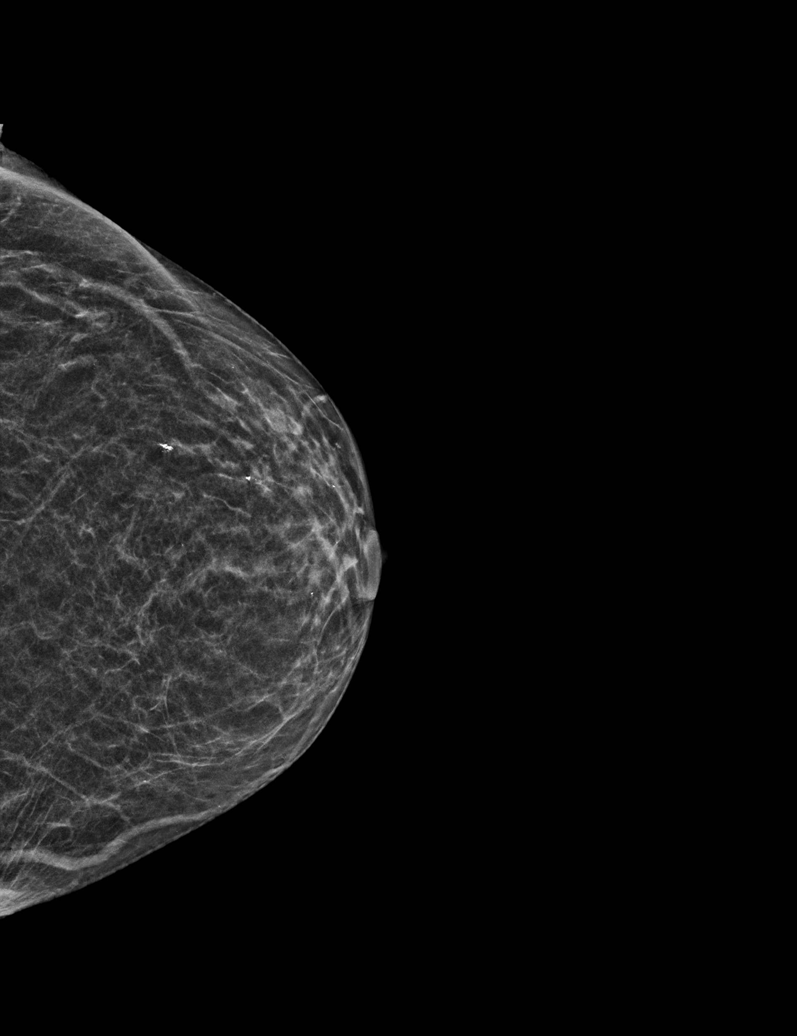

[R MLO tomo · tomo slice 23/45.0]
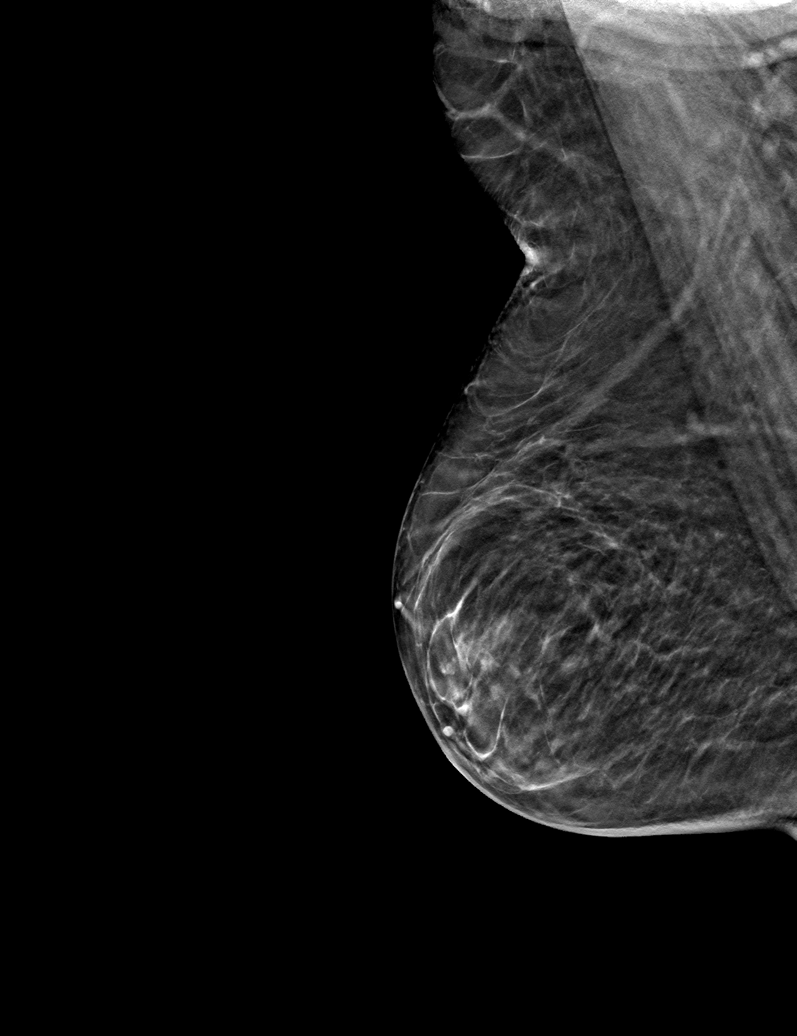

[6 of 30 positions shown; findings below may reference images not displayed]

ACR Breast Density Category b: There are scattered areas of
fibroglandular density.
FINDINGS: In the left breast, a possible mass warrants further evaluation. In
the right breast, no findings suspicious for malignancy.

The images were evaluated with computer-aided detection.
IMPRESSION: Further evaluation is suggested for a possible mass in the left
breast.

RECOMMENDATION:
Diagnostic mammogram and possibly ultrasound of the left breast.
(Code:[id number])

The patient will be contacted regarding the findings, and additional
imaging will be scheduled.

BI-RADS CATEGORY  0: Incomplete. Need additional imaging evaluation
and/or prior mammograms for comparison.

## 2021-05-18 DIAGNOSIS — Z86010 Personal history of colonic polyps: Secondary | ICD-10-CM | POA: Diagnosis not present

## 2021-06-14 DIAGNOSIS — Z86010 Personal history of colonic polyps: Secondary | ICD-10-CM | POA: Diagnosis not present

## 2021-06-14 DIAGNOSIS — K5730 Diverticulosis of large intestine without perforation or abscess without bleeding: Secondary | ICD-10-CM | POA: Diagnosis not present

## 2021-06-14 DIAGNOSIS — K5520 Angiodysplasia of colon without hemorrhage: Secondary | ICD-10-CM | POA: Diagnosis not present

## 2021-07-07 DIAGNOSIS — Z961 Presence of intraocular lens: Secondary | ICD-10-CM | POA: Diagnosis not present

## 2021-07-07 DIAGNOSIS — Z7984 Long term (current) use of oral hypoglycemic drugs: Secondary | ICD-10-CM | POA: Diagnosis not present

## 2021-07-07 DIAGNOSIS — H35342 Macular cyst, hole, or pseudohole, left eye: Secondary | ICD-10-CM | POA: Diagnosis not present

## 2021-07-07 DIAGNOSIS — E119 Type 2 diabetes mellitus without complications: Secondary | ICD-10-CM | POA: Diagnosis not present

## 2021-08-18 DIAGNOSIS — Z6835 Body mass index (BMI) 35.0-35.9, adult: Secondary | ICD-10-CM | POA: Diagnosis not present

## 2021-08-18 DIAGNOSIS — I10 Essential (primary) hypertension: Secondary | ICD-10-CM | POA: Diagnosis not present

## 2021-08-18 DIAGNOSIS — M170 Bilateral primary osteoarthritis of knee: Secondary | ICD-10-CM | POA: Diagnosis not present

## 2021-08-18 DIAGNOSIS — E1169 Type 2 diabetes mellitus with other specified complication: Secondary | ICD-10-CM | POA: Diagnosis not present

## 2021-08-18 DIAGNOSIS — E7800 Pure hypercholesterolemia, unspecified: Secondary | ICD-10-CM | POA: Diagnosis not present

## 2021-10-12 DIAGNOSIS — Z6833 Body mass index (BMI) 33.0-33.9, adult: Secondary | ICD-10-CM | POA: Diagnosis not present

## 2021-10-12 DIAGNOSIS — E119 Type 2 diabetes mellitus without complications: Secondary | ICD-10-CM | POA: Diagnosis not present

## 2021-10-12 DIAGNOSIS — I10 Essential (primary) hypertension: Secondary | ICD-10-CM | POA: Diagnosis not present

## 2022-02-10 DIAGNOSIS — Z1389 Encounter for screening for other disorder: Secondary | ICD-10-CM | POA: Diagnosis not present

## 2022-02-10 DIAGNOSIS — Z0000 Encounter for general adult medical examination without abnormal findings: Secondary | ICD-10-CM | POA: Diagnosis not present

## 2022-02-22 DIAGNOSIS — I10 Essential (primary) hypertension: Secondary | ICD-10-CM | POA: Diagnosis not present

## 2022-02-22 DIAGNOSIS — Z6835 Body mass index (BMI) 35.0-35.9, adult: Secondary | ICD-10-CM | POA: Diagnosis not present

## 2022-02-22 DIAGNOSIS — E1169 Type 2 diabetes mellitus with other specified complication: Secondary | ICD-10-CM | POA: Diagnosis not present

## 2022-02-22 DIAGNOSIS — M170 Bilateral primary osteoarthritis of knee: Secondary | ICD-10-CM | POA: Diagnosis not present

## 2022-02-22 DIAGNOSIS — Z23 Encounter for immunization: Secondary | ICD-10-CM | POA: Diagnosis not present

## 2022-02-22 DIAGNOSIS — E7800 Pure hypercholesterolemia, unspecified: Secondary | ICD-10-CM | POA: Diagnosis not present

## 2022-08-29 DIAGNOSIS — M170 Bilateral primary osteoarthritis of knee: Secondary | ICD-10-CM | POA: Diagnosis not present

## 2022-08-29 DIAGNOSIS — Z6835 Body mass index (BMI) 35.0-35.9, adult: Secondary | ICD-10-CM | POA: Diagnosis not present

## 2022-08-29 DIAGNOSIS — E7800 Pure hypercholesterolemia, unspecified: Secondary | ICD-10-CM | POA: Diagnosis not present

## 2022-08-29 DIAGNOSIS — I10 Essential (primary) hypertension: Secondary | ICD-10-CM | POA: Diagnosis not present

## 2022-08-29 DIAGNOSIS — E1165 Type 2 diabetes mellitus with hyperglycemia: Secondary | ICD-10-CM | POA: Diagnosis not present

## 2022-09-12 ENCOUNTER — Other Ambulatory Visit: Payer: Self-pay | Admitting: Family Medicine

## 2022-09-12 DIAGNOSIS — Z1231 Encounter for screening mammogram for malignant neoplasm of breast: Secondary | ICD-10-CM

## 2022-09-16 ENCOUNTER — Ambulatory Visit
Admission: RE | Admit: 2022-09-16 | Discharge: 2022-09-16 | Disposition: A | Discharge status: Home / Self Care | Payer: Medicare Other | Source: Ambulatory Visit | Attending: Family Medicine | Admitting: Family Medicine

## 2022-09-16 DIAGNOSIS — Z1231 Encounter for screening mammogram for malignant neoplasm of breast: Secondary | ICD-10-CM | POA: Diagnosis not present

## 2022-12-21 DIAGNOSIS — Z961 Presence of intraocular lens: Secondary | ICD-10-CM | POA: Diagnosis not present

## 2022-12-21 DIAGNOSIS — E119 Type 2 diabetes mellitus without complications: Secondary | ICD-10-CM | POA: Diagnosis not present

## 2022-12-21 DIAGNOSIS — H35342 Macular cyst, hole, or pseudohole, left eye: Secondary | ICD-10-CM | POA: Diagnosis not present

## 2023-02-14 DIAGNOSIS — Z961 Presence of intraocular lens: Secondary | ICD-10-CM | POA: Diagnosis not present

## 2023-02-14 DIAGNOSIS — H35342 Macular cyst, hole, or pseudohole, left eye: Secondary | ICD-10-CM | POA: Diagnosis not present

## 2023-02-27 DIAGNOSIS — Z6836 Body mass index (BMI) 36.0-36.9, adult: Secondary | ICD-10-CM | POA: Diagnosis not present

## 2023-02-27 DIAGNOSIS — Z0000 Encounter for general adult medical examination without abnormal findings: Secondary | ICD-10-CM | POA: Diagnosis not present

## 2023-02-27 DIAGNOSIS — Z1331 Encounter for screening for depression: Secondary | ICD-10-CM | POA: Diagnosis not present

## 2023-04-04 DIAGNOSIS — E7800 Pure hypercholesterolemia, unspecified: Secondary | ICD-10-CM | POA: Diagnosis not present

## 2023-04-04 DIAGNOSIS — M170 Bilateral primary osteoarthritis of knee: Secondary | ICD-10-CM | POA: Diagnosis not present

## 2023-04-04 DIAGNOSIS — I10 Essential (primary) hypertension: Secondary | ICD-10-CM | POA: Diagnosis not present

## 2023-04-04 DIAGNOSIS — E1165 Type 2 diabetes mellitus with hyperglycemia: Secondary | ICD-10-CM | POA: Diagnosis not present

## 2023-04-04 DIAGNOSIS — Z6835 Body mass index (BMI) 35.0-35.9, adult: Secondary | ICD-10-CM | POA: Diagnosis not present

## 2023-08-29 ENCOUNTER — Other Ambulatory Visit: Payer: Self-pay | Admitting: Family Medicine

## 2023-08-29 DIAGNOSIS — Z1231 Encounter for screening mammogram for malignant neoplasm of breast: Secondary | ICD-10-CM

## 2023-09-18 ENCOUNTER — Ambulatory Visit
Admission: RE | Admit: 2023-09-18 | Discharge: 2023-09-18 | Disposition: A | Discharge status: Home / Self Care | Source: Ambulatory Visit | Attending: Family Medicine | Admitting: Family Medicine

## 2023-09-18 DIAGNOSIS — Z1231 Encounter for screening mammogram for malignant neoplasm of breast: Secondary | ICD-10-CM | POA: Diagnosis not present

## 2023-10-25 DIAGNOSIS — E7800 Pure hypercholesterolemia, unspecified: Secondary | ICD-10-CM | POA: Diagnosis not present

## 2023-10-25 DIAGNOSIS — E1165 Type 2 diabetes mellitus with hyperglycemia: Secondary | ICD-10-CM | POA: Diagnosis not present

## 2023-10-25 DIAGNOSIS — Z6835 Body mass index (BMI) 35.0-35.9, adult: Secondary | ICD-10-CM | POA: Diagnosis not present

## 2023-10-25 DIAGNOSIS — M170 Bilateral primary osteoarthritis of knee: Secondary | ICD-10-CM | POA: Diagnosis not present

## 2023-10-25 DIAGNOSIS — I10 Essential (primary) hypertension: Secondary | ICD-10-CM | POA: Diagnosis not present
